# Patient Record
Sex: Male | Born: 1940 | Race: White | Hispanic: No | Marital: Married | Smoking: Former smoker
Health system: Southern US, Community
[De-identification: ages and names within clinical notes are randomized; demographics above are authoritative.]

## PROBLEM LIST (undated history)

## (undated) DIAGNOSIS — C801 Malignant (primary) neoplasm, unspecified: Secondary | ICD-10-CM

## (undated) DIAGNOSIS — I10 Essential (primary) hypertension: Secondary | ICD-10-CM

## (undated) DIAGNOSIS — F419 Anxiety disorder, unspecified: Secondary | ICD-10-CM

## (undated) DIAGNOSIS — E039 Hypothyroidism, unspecified: Secondary | ICD-10-CM

## (undated) DIAGNOSIS — R011 Cardiac murmur, unspecified: Secondary | ICD-10-CM

## (undated) DIAGNOSIS — E079 Disorder of thyroid, unspecified: Secondary | ICD-10-CM

## (undated) DIAGNOSIS — C4491 Basal cell carcinoma of skin, unspecified: Secondary | ICD-10-CM

## (undated) DIAGNOSIS — M1990 Unspecified osteoarthritis, unspecified site: Secondary | ICD-10-CM

## (undated) HISTORY — PX: ARTHROSCOPY KNEE W/ DRILLING: SUR92

## (undated) HISTORY — DX: Basal cell carcinoma of skin, unspecified: C44.91

## (undated) HISTORY — PX: OTHER SURGICAL HISTORY: SHX169

## (undated) HISTORY — PX: BASAL CELL CARCINOMA EXCISION: SHX1214

## (undated) HISTORY — DX: Anxiety disorder, unspecified: F41.9

## (undated) HISTORY — DX: Disorder of thyroid, unspecified: E07.9

## (undated) HISTORY — DX: Unspecified osteoarthritis, unspecified site: M19.90

## (undated) HISTORY — PX: TRANSURETHRAL RESECTION OF PROSTATE: SHX73

## (undated) HISTORY — PX: EYE SURGERY: SHX253

## (undated) HISTORY — PX: COLON SURGERY: SHX602

## (undated) HISTORY — PX: JOINT REPLACEMENT: SHX530

## (undated) HISTORY — DX: Malignant (primary) neoplasm, unspecified: C80.1

---

## 2002-04-18 ENCOUNTER — Encounter: Admission: RE | Admit: 2002-04-18 | Discharge: 2002-04-18 | Payer: Self-pay | Admitting: Family Medicine

## 2002-04-18 ENCOUNTER — Encounter: Payer: Self-pay | Admitting: Family Medicine

## 2002-04-26 ENCOUNTER — Encounter: Payer: Self-pay | Admitting: Specialist

## 2002-04-26 ENCOUNTER — Ambulatory Visit (HOSPITAL_COMMUNITY): Admission: RE | Admit: 2002-04-26 | Discharge: 2002-04-26 | Payer: Self-pay | Admitting: Specialist

## 2002-05-12 ENCOUNTER — Ambulatory Visit (HOSPITAL_BASED_OUTPATIENT_CLINIC_OR_DEPARTMENT_OTHER): Admission: RE | Admit: 2002-05-12 | Discharge: 2002-05-12 | Payer: Self-pay | Admitting: Specialist

## 2004-08-24 ENCOUNTER — Encounter: Admission: RE | Admit: 2004-08-24 | Discharge: 2004-08-24 | Payer: Self-pay | Admitting: Family Medicine

## 2004-09-03 ENCOUNTER — Encounter: Admission: RE | Admit: 2004-09-03 | Discharge: 2004-09-03 | Payer: Self-pay | Admitting: Family Medicine

## 2004-09-08 ENCOUNTER — Encounter: Admission: RE | Admit: 2004-09-08 | Discharge: 2004-09-08 | Payer: Self-pay | Admitting: Family Medicine

## 2004-11-20 ENCOUNTER — Encounter: Admission: RE | Admit: 2004-11-20 | Discharge: 2004-11-20 | Payer: Self-pay | Admitting: Family Medicine

## 2004-12-08 ENCOUNTER — Encounter: Admission: RE | Admit: 2004-12-08 | Discharge: 2004-12-08 | Payer: Self-pay | Admitting: Family Medicine

## 2004-12-08 ENCOUNTER — Other Ambulatory Visit: Admission: RE | Admit: 2004-12-08 | Discharge: 2004-12-08 | Payer: Self-pay | Admitting: Interventional Radiology

## 2005-02-27 ENCOUNTER — Ambulatory Visit (HOSPITAL_COMMUNITY): Admission: RE | Admit: 2005-02-27 | Discharge: 2005-02-28 | Payer: Self-pay

## 2005-09-01 ENCOUNTER — Ambulatory Visit: Payer: Self-pay | Admitting: Gastroenterology

## 2005-09-15 ENCOUNTER — Ambulatory Visit: Payer: Self-pay | Admitting: Gastroenterology

## 2005-12-29 IMAGING — US US SOFT TISSUE HEAD/NECK
1 series · 14 of 25 positions shown · non-contrast
Comparison: none

CLINICAL DATA: 63 year-old-male with left thyroid lesion. 
 THYROID ULTRASOUND:
TECHNIQUE: Ultrasound examination of the thyroid gland and adjacent soft tissue structures was performed.

[Series 1: unknown · 0.08mm/px · 14 of 44 slices shown]
[im 1/44]
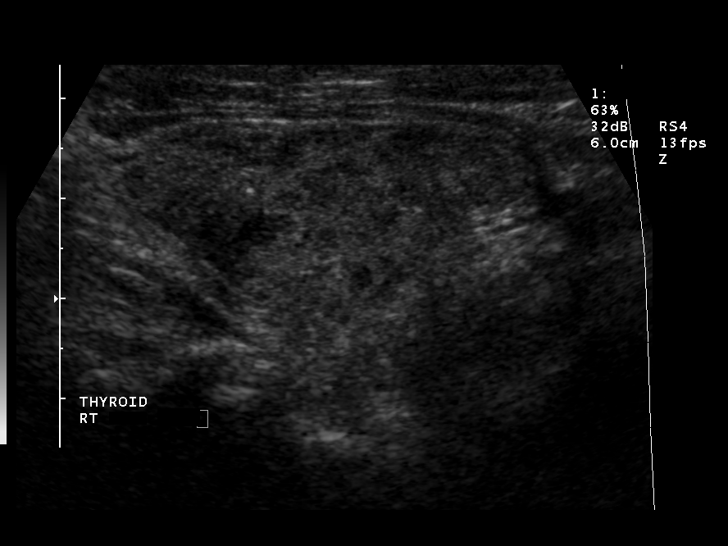
[im 4/44]
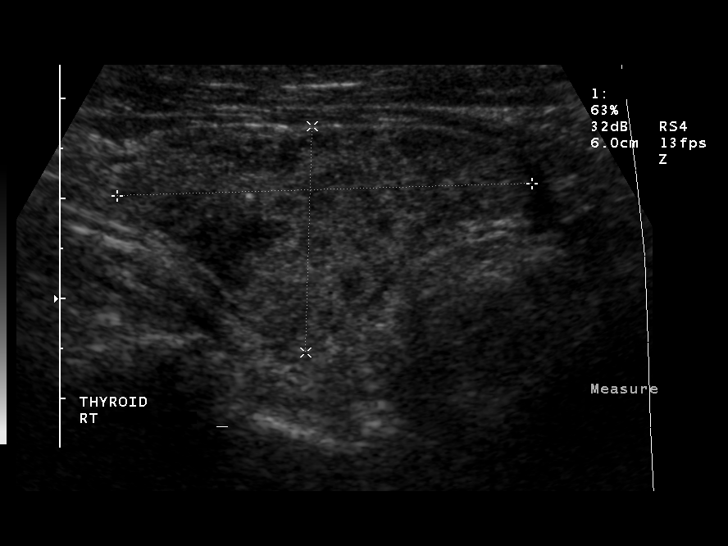
[im 8/44]
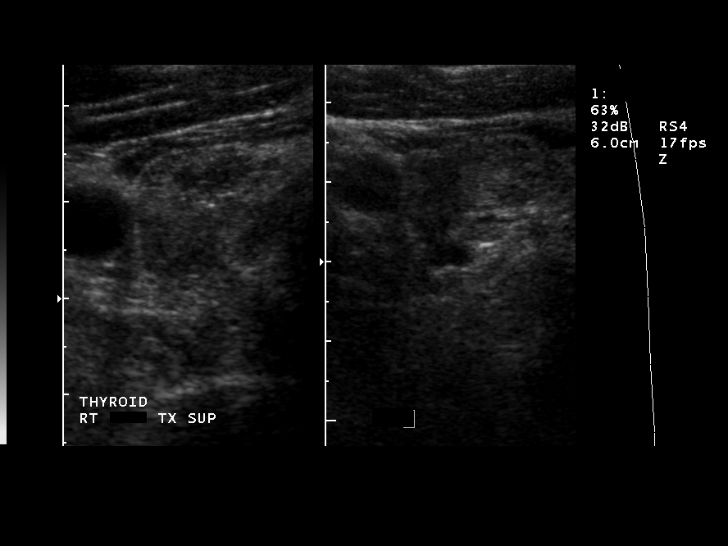
[im 11/44]
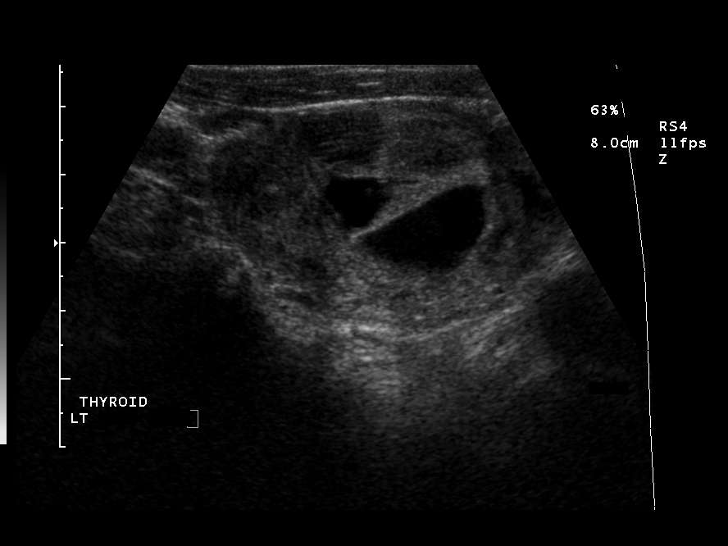
[im 15/44]
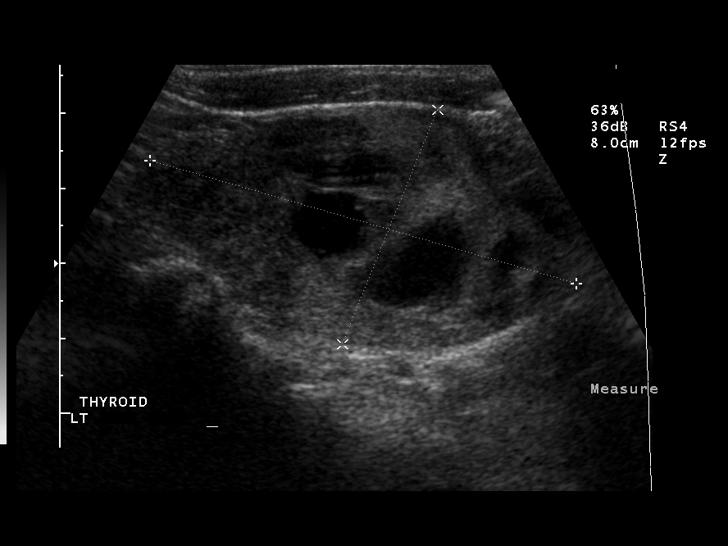
[im 17/44]
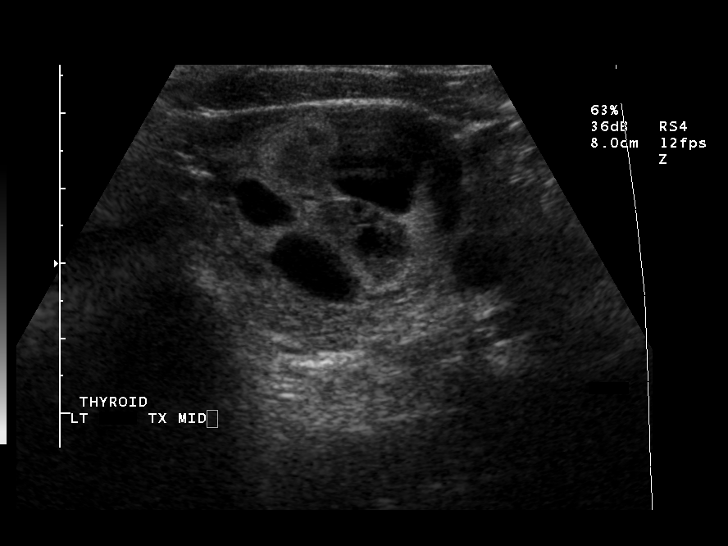
[im 20/44]
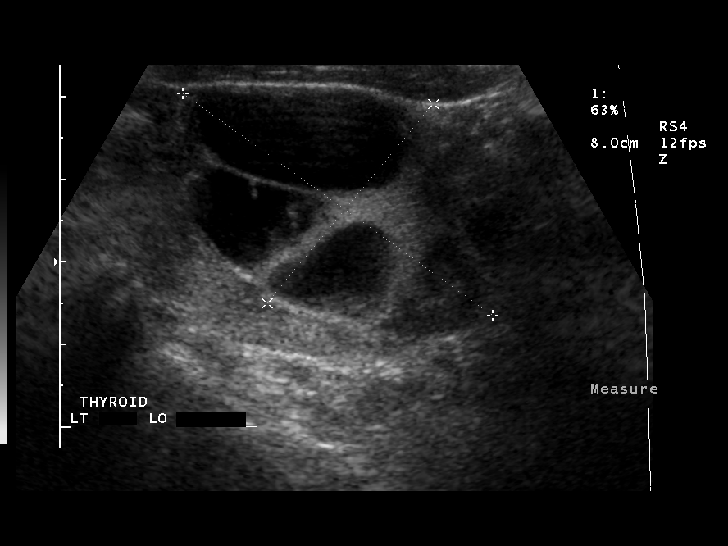
[im 24/44]
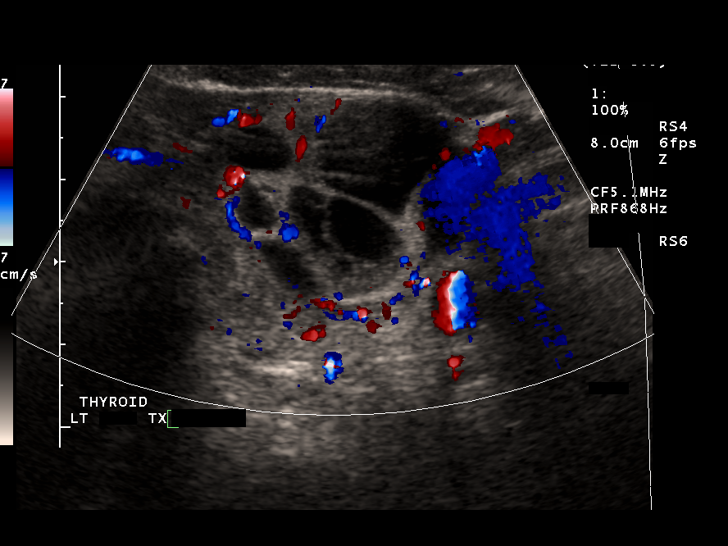
[im 27/44]
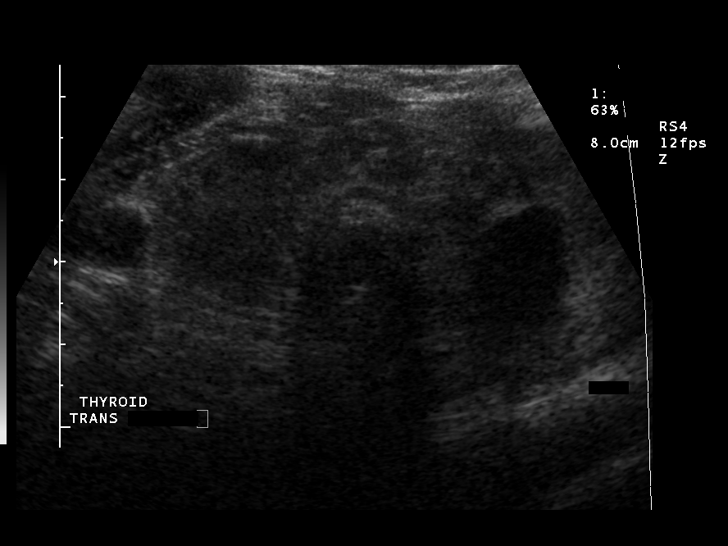
[im 29/44]
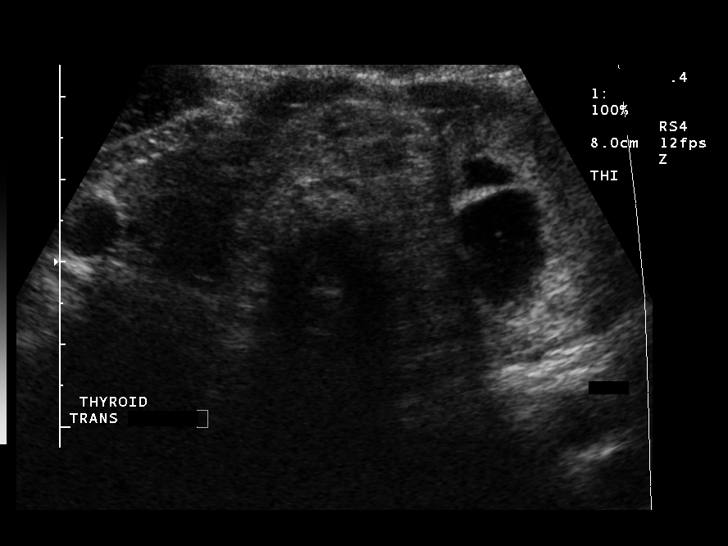
[im 33/44]
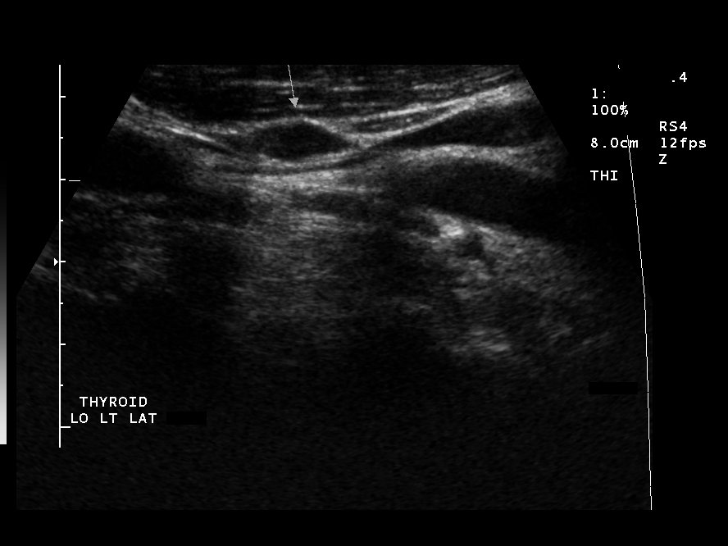
[im 36/44]
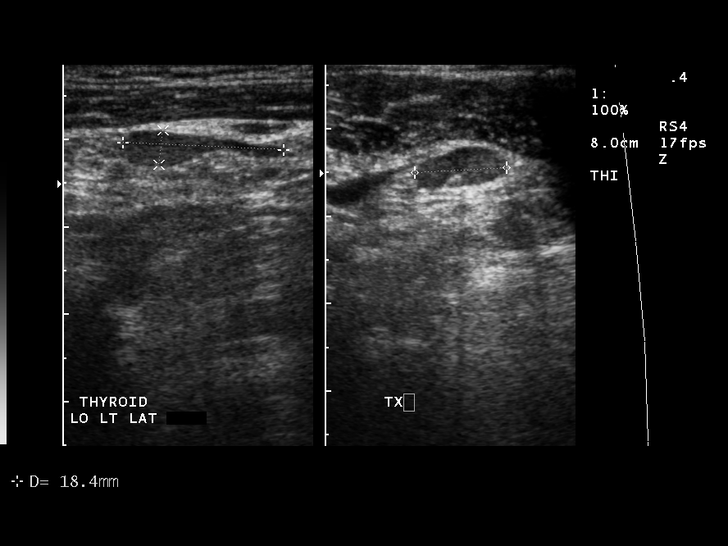
[im 40/44]
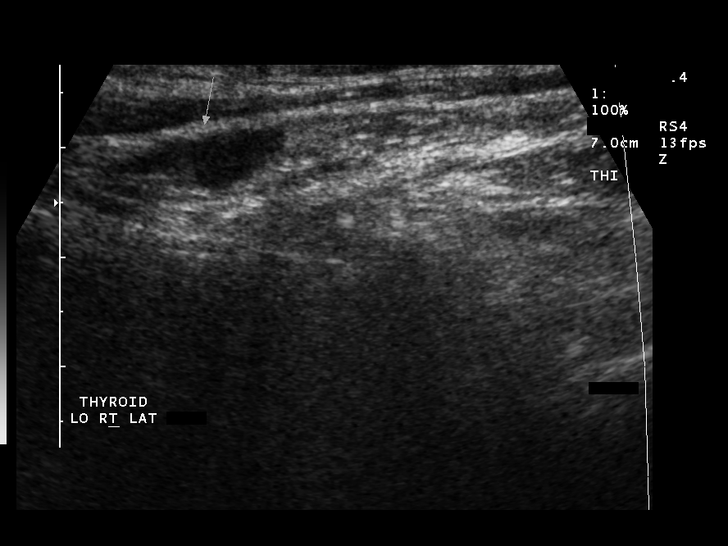
[im 44/44]
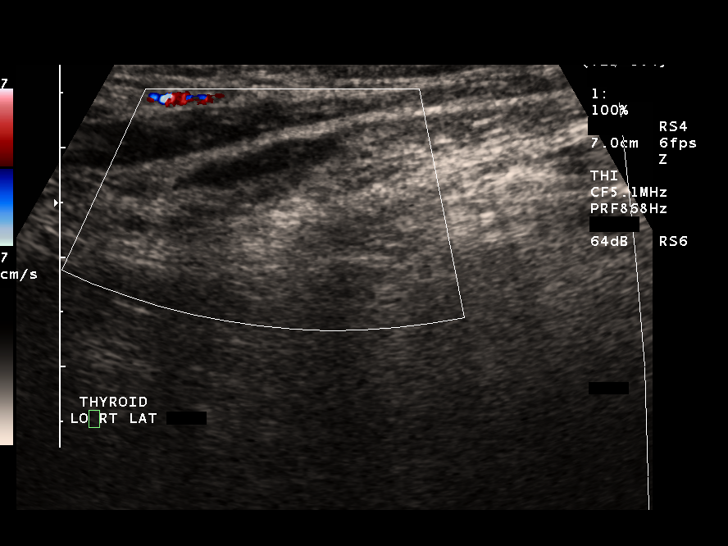

[14 of 25 positions shown; findings below may reference images not displayed]

FINDINGS: The right thyroid lobe measures 4.5 x 2.0 x 1.8 cm.  The left lobe measures 6.4 x 3.8 cm.  The isthmus measures .9 cm.   The echotexture of the thyroid gland is heterogeneous.  There is a large complex cystic and solid lesion and occupying most of the lower left thyroid gland which measures 4.6 x 3.1 x 3.1 cm.  The right gland is very heterogeneous and demonstrates some small ill-defined nodules.  Small lymph nodes are noted in the neck bilaterally.
IMPRESSION: Thyroid goiter.  There is a large complex cystic and solid lesion occupying most of the left thyroid lobe which needs to be biopsied.

## 2006-01-16 IMAGING — US US BIOPSY
1 series · 5 of 5 positions shown · non-contrast
Comparison: none

CLINICAL DATA: Large complex nodule of the left thyroid.  Request has been made for biopsy.
ULTRASOUND GUIDED THYROID BIOPSY LEFT LOBE OF THYROID:

[Series 1: unknown · 0.07mm/px · 5 of 5 slices shown]
[im 1/5]
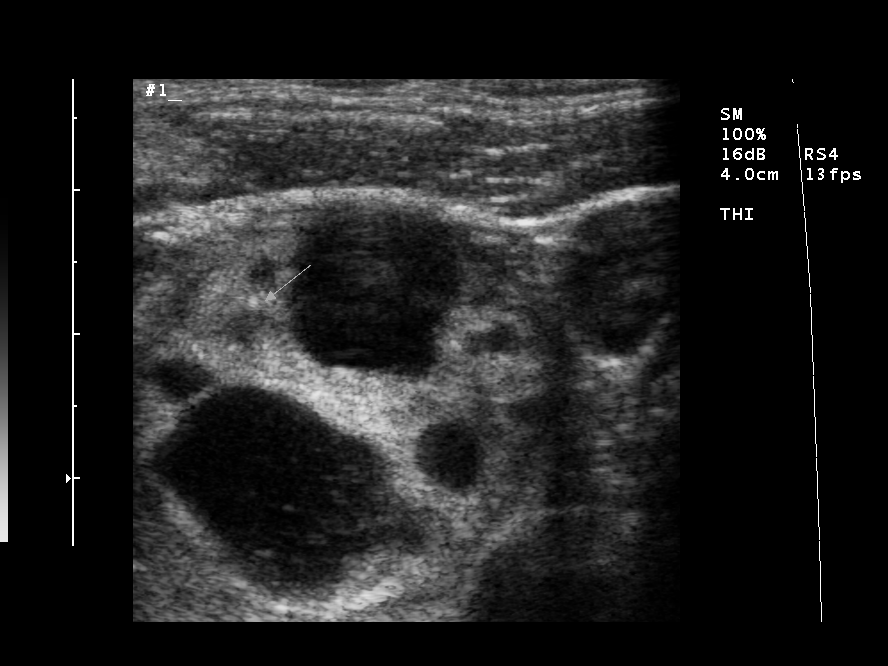
[im 2/5]
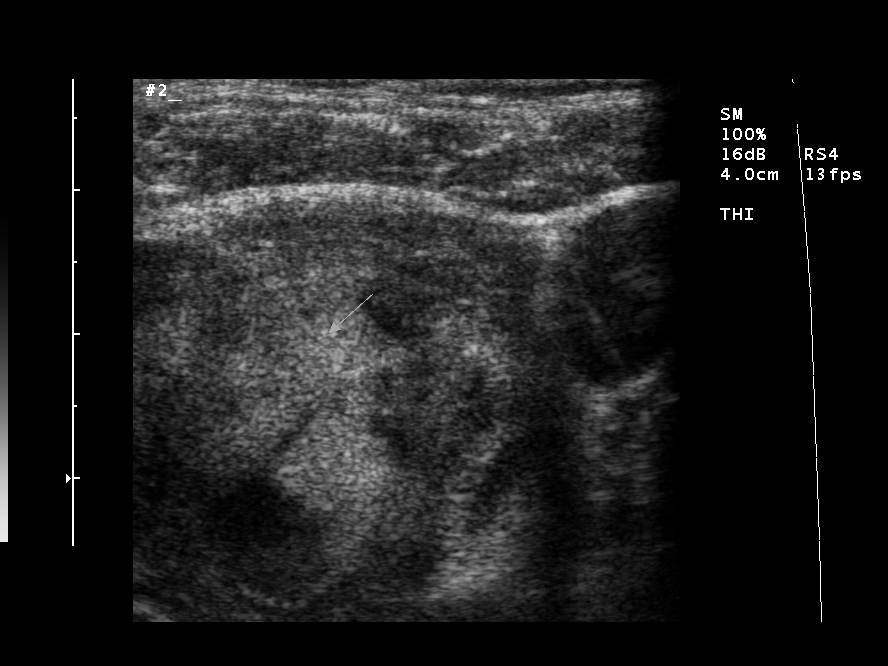
[im 3/5]
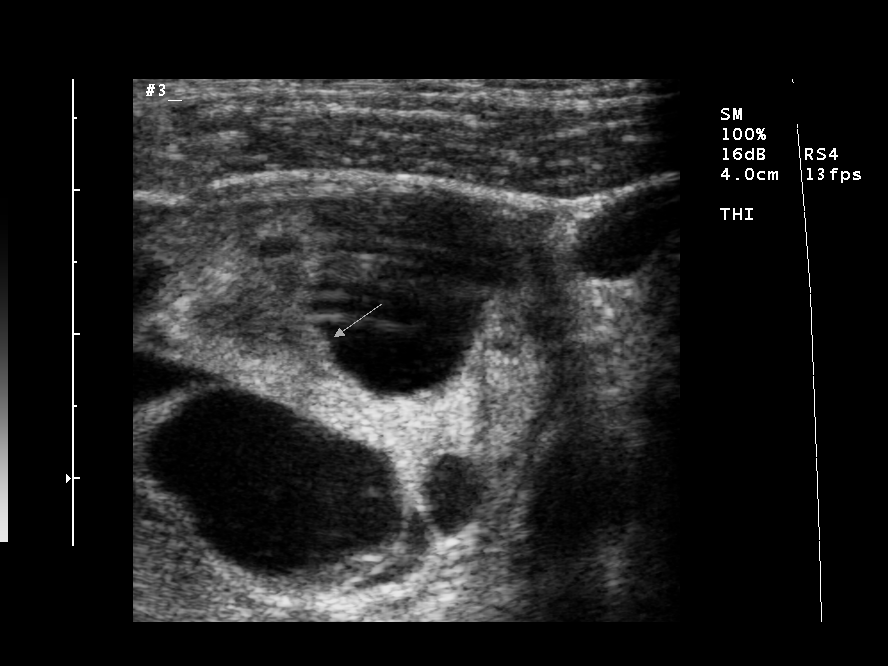
[im 4/5]
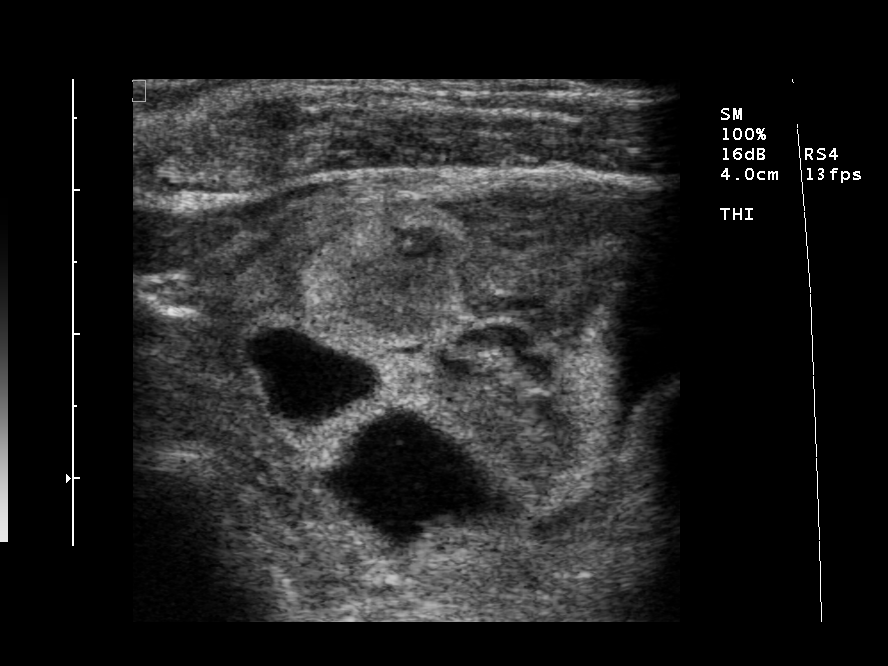
[im 5/5]
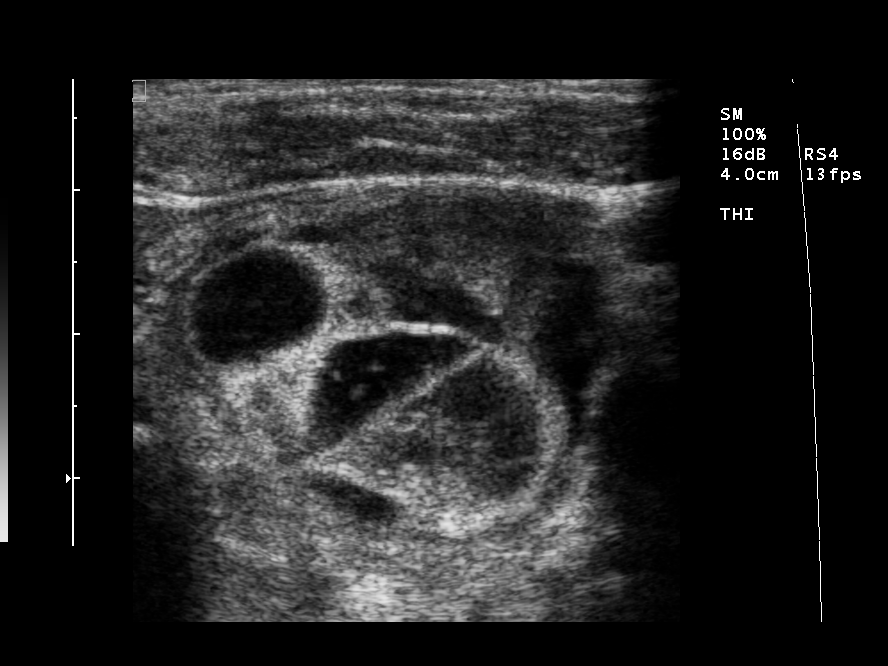

[5 of 5 positions shown; findings below may reference images not displayed]

FINDINGS: The above procedure was thoroughly discussed with the patient and written informed consent was obtained.
Ultrasound was then performed to localize and mark an adequate site for the biopsy.  The patient was then prepped and draped in a normal sterile fashion, 1% Lidocaine was used for local anesthesia.  Using direct ultrasound guidance, three passes were made using a 25 gauge hypodermic needle into the wall of the complex nodule located within the left lobe of the thyroid.  Ultrasound confirmed placement of the needle on all three occasions. Aspiration was performed of two the larger cysts within the thyroid as well.   The specimens were given to pathology for further analysis.  Post procedure imaging demonstrated no hematoma or immediate complication. The patient tolerated the procedure well.
IMPRESSION: Successful ultrasound guided fine needle aspiration left lobe of the thyroid.  Final pathology pending.

## 2010-07-31 ENCOUNTER — Other Ambulatory Visit: Payer: Self-pay | Admitting: Dermatology

## 2010-11-10 ENCOUNTER — Encounter: Payer: Self-pay | Admitting: Gastroenterology

## 2010-11-17 ENCOUNTER — Encounter: Payer: Self-pay | Admitting: Gastroenterology

## 2010-12-03 ENCOUNTER — Encounter: Payer: Self-pay | Admitting: Gastroenterology

## 2010-12-03 ENCOUNTER — Ambulatory Visit (AMBULATORY_SURGERY_CENTER): Payer: 59 | Admitting: *Deleted

## 2010-12-03 VITALS — Ht 68.0 in | Wt 180.2 lb

## 2010-12-03 DIAGNOSIS — Z1211 Encounter for screening for malignant neoplasm of colon: Secondary | ICD-10-CM

## 2010-12-03 MED ORDER — SUPREP BOWEL PREP KIT 17.5-3.13-1.6 GM/177ML PO SOLN: 1.0000 | Freq: Once | ORAL | Status: DC

## 2010-12-17 ENCOUNTER — Ambulatory Visit (AMBULATORY_SURGERY_CENTER): Payer: 59 | Admitting: Gastroenterology

## 2010-12-17 ENCOUNTER — Encounter: Payer: Self-pay | Admitting: Gastroenterology

## 2010-12-17 VITALS — BP 185/90 | HR 93 | Temp 98.5°F | Resp 22 | Ht 68.0 in | Wt 179.0 lb

## 2010-12-17 DIAGNOSIS — Z86010 Personal history of colonic polyps: Secondary | ICD-10-CM

## 2010-12-17 DIAGNOSIS — D126 Benign neoplasm of colon, unspecified: Secondary | ICD-10-CM

## 2010-12-17 DIAGNOSIS — Z1211 Encounter for screening for malignant neoplasm of colon: Secondary | ICD-10-CM

## 2010-12-17 MED ORDER — SODIUM CHLORIDE 0.9 % IV SOLN: 500.0000 mL | INTRAVENOUS | Status: AC

## 2010-12-17 NOTE — Patient Instructions (Signed)
Green and blue discharge instructions reviewed with patient and care partner.  Impressions/recommendations:  Polyps (handout given)  Resume medications as you were taking them prior to your procedure.

## 2010-12-17 NOTE — Progress Notes (Signed)
PT TALKATIVE WITH START OF SEDATION. MEDICINES TITRATED PER MD. WITH ADVANCING SCOPE TO CECUM, PT VERY UNCOMFORTABLE, GRIMACING, RESTLESS. MEDS TITRATED PER MD. AFTER REACHING CECUM, PT RESTING WITH EYES CLOSED. NO DISTRESS NOTED. DENIES PAIN. EWM

## 2010-12-18 ENCOUNTER — Telehealth: Payer: Self-pay | Admitting: *Deleted

## 2010-12-18 NOTE — Telephone Encounter (Signed)
No ID on voicemail; no message left 

## 2011-06-04 ENCOUNTER — Other Ambulatory Visit: Payer: Self-pay | Admitting: Dermatology

## 2013-10-27 ENCOUNTER — Encounter: Payer: Self-pay | Admitting: Gastroenterology

## 2014-02-20 ENCOUNTER — Other Ambulatory Visit (HOSPITAL_COMMUNITY): Payer: 59

## 2014-03-02 ENCOUNTER — Inpatient Hospital Stay (HOSPITAL_COMMUNITY): Admission: RE | Admit: 2014-03-02 | Payer: 59 | Source: Ambulatory Visit | Admitting: Orthopedic Surgery

## 2014-03-02 ENCOUNTER — Encounter (HOSPITAL_COMMUNITY): Admission: RE | Payer: Self-pay | Source: Ambulatory Visit

## 2014-03-02 SURGERY — ARTHROPLASTY, KNEE, TOTAL
Anesthesia: General | Site: Knee | Laterality: Left

## 2014-04-10 ENCOUNTER — Encounter (HOSPITAL_COMMUNITY): Payer: Self-pay

## 2014-04-10 ENCOUNTER — Encounter (HOSPITAL_COMMUNITY)
Admission: RE | Admit: 2014-04-10 | Discharge: 2014-04-10 | Disposition: A | Discharge status: Home / Self Care | Payer: Medicare Other | Source: Ambulatory Visit | Attending: Orthopedic Surgery | Admitting: Orthopedic Surgery

## 2014-04-10 DIAGNOSIS — M1712 Unilateral primary osteoarthritis, left knee: Secondary | ICD-10-CM | POA: Insufficient documentation

## 2014-04-10 DIAGNOSIS — Z01812 Encounter for preprocedural laboratory examination: Secondary | ICD-10-CM | POA: Diagnosis present

## 2014-04-10 DIAGNOSIS — Z01818 Encounter for other preprocedural examination: Secondary | ICD-10-CM | POA: Insufficient documentation

## 2014-04-10 HISTORY — DX: Cardiac murmur, unspecified: R01.1

## 2014-04-10 HISTORY — DX: Essential (primary) hypertension: I10

## 2014-04-10 HISTORY — DX: Hypothyroidism, unspecified: E03.9

## 2014-04-10 LAB — CBC
HCT: 43.9 % (ref 39.0–52.0)
Hemoglobin: 15.2 g/dL (ref 13.0–17.0)
MCH: 31.3 pg (ref 26.0–34.0)
MCHC: 34.6 g/dL (ref 30.0–36.0)
MCV: 90.5 fL (ref 78.0–100.0)
PLATELETS: 229 10*3/uL (ref 150–400)
RBC: 4.85 MIL/uL (ref 4.22–5.81)
RDW: 12.6 % (ref 11.5–15.5)
WBC: 8 10*3/uL (ref 4.0–10.5)

## 2014-04-10 LAB — PROTIME-INR
INR: 0.98 (ref 0.00–1.49)
Prothrombin Time: 13.1 seconds (ref 11.6–15.2)

## 2014-04-10 LAB — APTT: aPTT: 32 seconds (ref 24–37)

## 2014-04-10 LAB — SURGICAL PCR SCREEN
MRSA, PCR: NEGATIVE
STAPHYLOCOCCUS AUREUS: NEGATIVE

## 2014-04-10 LAB — BASIC METABOLIC PANEL
Anion gap: 6 (ref 5–15)
BUN: 19 mg/dL (ref 6–23)
CHLORIDE: 108 meq/L (ref 96–112)
CO2: 25 mmol/L (ref 19–32)
Calcium: 8.7 mg/dL (ref 8.4–10.5)
Creatinine, Ser: 1.34 mg/dL (ref 0.50–1.35)
GFR calc Af Amer: 58 mL/min — ABNORMAL LOW (ref >90)
GFR calc non Af Amer: 50 mL/min — ABNORMAL LOW (ref >90)
Glucose, Bld: 96 mg/dL (ref 70–99)
POTASSIUM: 4 mmol/L (ref 3.5–5.1)
Sodium: 139 mmol/L (ref 135–145)

## 2014-04-10 LAB — TYPE AND SCREEN
ABO/RH(D): B POS
Antibody Screen: NEGATIVE

## 2014-04-10 LAB — ABO/RH: ABO/RH(D): B POS

## 2014-04-10 NOTE — Progress Notes (Signed)
req'd ekg from dr Vallarie Mare badger at [employer] summer field.

## 2014-04-10 NOTE — Pre-Procedure Instructions (Addendum)
BHAVIN MONJARAZ  04/10/2014   Your procedure is scheduled on:  04/20/14  Report to [facility] short stay admitting at 900 AM.  Call this number if you have problems the morning of surgery: [phone]   Remember:   Do not eat food or drink liquids after midnight.   Take these medicines the morning of surgery with A SIP OF WATER: synthroid, xanax  if needed   STOP all herbel meds, nsaids (aleve,naproxen,advil,ibuprofen) 5 days prior to surgery starting 04/15/14   Do not wear jewelry, make-up or nail polish.  Do not wear lotions, powders, or perfumes. You may wear deodorant.  Do not shave 48 hours prior to surgery. Men may shave face and neck.  Do not bring valuables to the hospital.  [facility] is not responsible                  for any belongings or valuables.               Contacts, dentures or bridgework may not be worn into surgery.  Leave suitcase in the car. After surgery it may be brought to your room.  For patients admitted to the hospital, discharge time is determined by your                treatment team.               Patients discharged the day of surgery will not be allowed to drive  home.  Name and phone number of your driver:   Special Instructions:  Special Instructions: [place] - Preparing for Surgery  Before surgery, you can play an important role.  Because skin is not sterile, your skin needs to be as free of germs as possible.  You can reduce the number of germs on you skin by washing with CHG (chlorahexidine gluconate) soap before surgery.  CHG is an antiseptic cleaner which kills germs and bonds with the skin to continue killing germs even after washing.  Please DO NOT use if you have an allergy to CHG or antibacterial soaps.  If your skin becomes reddened/irritated stop using the CHG and inform your nurse when you arrive at Short Stay.  Do not shave (including legs and underarms) for at least 48 hours prior to the first CHG shower.  You may shave your  face.  Please follow these instructions carefully:   1.  Shower with CHG Soap the night before surgery and the morning of Surgery.  2.  If you choose to wash your hair, wash your hair first as usual with your normal shampoo.  3.  After you shampoo, rinse your hair and body thoroughly to remove the Shampoo.  4.  Use CHG as you would any other liquid soap.  You can apply chg directly  to the skin and wash gently with scrungie or a clean washcloth.  5.  Apply the CHG Soap to your body ONLY FROM THE NECK DOWN.  Do not use on open wounds or open sores.  Avoid contact with your eyes ears, mouth and genitals (private parts).  Wash genitals (private parts)       with your normal soap.  6.  Wash thoroughly, paying special attention to the area where your surgery will be performed.  7.  Thoroughly rinse your body with warm water from the neck down.  8.  DO NOT shower/wash with your normal soap after using and rinsing off the CHG Soap.  9.  Fraser Din  yourself dry with a clean towel.            10.  Wear clean pajamas.            11.  Place clean sheets on your bed the night of your first shower and do not sleep with pets.  Day of Surgery  Do not apply any lotions/deodorants the morning of surgery.  Please wear clean clothes to the hospital/surgery center.   Please read over the following fact sheets that you were given: Pain Booklet, Coughing and Deep Breathing, Blood Transfusion Information, Total Joint Packet, MRSA Information and Surgical Site Infection Prevention

## 2014-04-10 NOTE — H&P (Signed)
Jeffrey Marks is an 74 y.o. male.    Chief Complaint: left knee pain  HPI: Pt is a 74 y.o. male complaining of left knee pain for multiple years. Pain had continually increased since the beginning. X-rays in the clinic show end-stage arthritic changes of the left knee. Pt has tried various conservative treatments which have failed to alleviate their symptoms, including injections and therapy. Various options are discussed with the patient. Risks, benefits and expectations were discussed with the patient. Patient understand the risks, benefits and expectations and wishes to proceed with surgery.   PCP:  Chesley Noon, MD  D/C Plans:  Home with HHPT  PMH: Past Medical History  Diagnosis Date  . Anxiety   . Arthritis   . Cancer   . Cataract     s/p  . Thyroid disease   . Basal cell carcinoma     rt. forearm and nose  . Hypertension   . Heart murmur   . Hypothyroidism     PSH: Past Surgical History  Procedure Laterality Date  . Basal cell carcinoma excision      removal -rt forearm and nose  . Arthroscopy knee w/ drilling      rt. knee  . Thryoid biopsy      2006  . Transurethral resection of prostate      2009  . Eye surgery      cataracts  . Colon surgery      denies has had colonoscopy    Social History:  reports that he quit smoking about 3 years ago. His smoking use included Cigarettes. He smoked 0.00 packs per day. He has never used smokeless tobacco. He reports that he drinks about 12.0 oz of alcohol per week. He reports that he does not use illicit drugs.  Allergies:  Allergies  Allergen Reactions  . Sulfa Antibiotics Rash    Medications: Current Facility-Administered Medications  Medication Dose Route Frequency Provider Last Rate Last Dose  . 0.9 %  sodium chloride infusion  500 mL Intravenous Continuous Milus Banister, MD       Current Outpatient Prescriptions  Medication Sig Dispense Refill  . ALPRAZolam (XANAX) 0.5 MG tablet Take 0.5 mg by  mouth 2 (two) times daily.     Marland Kitchen ibuprofen (ADVIL,MOTRIN) 200 MG tablet Take 400-600 mg by mouth daily as needed (pain).    Marland Kitchen levothyroxine (SYNTHROID, LEVOTHROID) 112 MCG tablet Take 112 mcg by mouth daily before breakfast.     . lisinopril (PRINIVIL,ZESTRIL) 20 MG tablet Take 20 mg by mouth daily.     . Misc Natural Products (URINOZINC) CAPS Take 2 capsules by mouth daily. For prostate health    . sildenafil (VIAGRA) 50 MG tablet Take 25 mg by mouth as needed for erectile dysfunction.       Results for orders placed or performed during the hospital encounter of 04/10/14 (from the past 48 hour(s))  Type and screen     Status: None   Collection Time: 04/10/14  1:40 PM  Result Value Ref Range   ABO/RH(D) B POS    Antibody Screen NEG    Sample Expiration 04/24/2014   Surgical pcr screen     Status: None   Collection Time: 04/10/14  1:46 PM  Result Value Ref Range   MRSA, PCR NEGATIVE NEGATIVE   Staphylococcus aureus NEGATIVE NEGATIVE    Comment:        The Xpert SA Assay (FDA approved for NASAL specimens in patients over 21 years  of age), is one component of a comprehensive surveillance program.  Test performance has been validated by [employer] for patients greater than or equal to 81 year old. It is not intended to diagnose infection nor to guide or monitor treatment.   Protime-INR     Status: None   Collection Time: 04/10/14  1:46 PM  Result Value Ref Range   Prothrombin Time 13.1 11.6 - 15.2 seconds   INR 0.98 0.00 - 1.49  PTT     Status: None   Collection Time: 04/10/14  1:46 PM  Result Value Ref Range   aPTT 32 24 - 37 seconds  Basic metabolic panel     Status: Abnormal   Collection Time: 04/10/14  1:46 PM  Result Value Ref Range   Sodium 139 135 - 145 mmol/L    Comment: Please note change in reference range.   Potassium 4.0 3.5 - 5.1 mmol/L    Comment: Please note change in reference range.   Chloride 108 96 - 112 mEq/L   CO2 25 19 - 32 mmol/L   Glucose, Bld  96 70 - 99 mg/dL   BUN 19 6 - 23 mg/dL   Creatinine, Ser 1.34 0.50 - 1.35 mg/dL   Calcium 8.7 8.4 - 10.5 mg/dL   GFR calc non Af Amer 50 (L) >90 mL/min   GFR calc Af Amer 58 (L) >90 mL/min    Comment: (NOTE) The eGFR has been calculated using the CKD EPI equation. This calculation has not been validated in all clinical situations. eGFR's persistently <90 mL/min signify possible Chronic Kidney Disease.    Anion gap 6 5 - 15  CBC     Status: None   Collection Time: 04/10/14  1:46 PM  Result Value Ref Range   WBC 8.0 4.0 - 10.5 K/uL   RBC 4.85 4.22 - 5.81 MIL/uL   Hemoglobin 15.2 13.0 - 17.0 g/dL   HCT 43.9 39.0 - 52.0 %   MCV 90.5 78.0 - 100.0 fL   MCH 31.3 26.0 - 34.0 pg   MCHC 34.6 30.0 - 36.0 g/dL   RDW 12.6 11.5 - 15.5 %   Platelets 229 150 - 400 K/uL   No results found.  ROS: Pain with rom of the left lower extremity  Physical Exam:  Alert and oriented 74 y.o. male in no acute distress Cranial nerves 2-12 intact Cervical spine: full rom with no tenderness, nv intact distally Chest: active breath sounds bilaterally, no wheeze rhonchi or rales Heart: regular rate and rhythm, no murmur Abd: non tender non distended with active bowel sounds Hip is stable with rom  Left knee with moderate crepitus with rom nv intact distally Antalgic gait No rashes  Assessment/Plan Assessment: left knee end stage osteoarthritis   Plan: Patient will undergo a left total knee arthroplasty by Dr. Veverly Fells at [facility]. Risks benefits and expectations were discussed with the patient. Patient understand risks, benefits and expectations and wishes to proceed.

## 2014-04-19 MED ORDER — CEFAZOLIN SODIUM-DEXTROSE 2-3 GM-% IV SOLR
2.0000 g | INTRAVENOUS | Status: AC
  Administered 2014-04-20: 2 g via INTRAVENOUS
  Filled 2014-04-19: qty 50

## 2014-04-20 ENCOUNTER — Encounter (HOSPITAL_COMMUNITY)
Admission: RE | Disposition: A | Discharge status: Home Health | Payer: Self-pay | Source: Ambulatory Visit | Attending: Orthopedic Surgery

## 2014-04-20 ENCOUNTER — Inpatient Hospital Stay (HOSPITAL_COMMUNITY): Payer: Medicare Other | Admitting: Anesthesiology

## 2014-04-20 ENCOUNTER — Inpatient Hospital Stay (HOSPITAL_COMMUNITY)
Admission: RE | Admit: 2014-04-20 | Discharge: 2014-04-22 | DRG: 470 | Disposition: A | Discharge status: Home Health | Payer: Medicare Other | Source: Ambulatory Visit | Attending: Orthopedic Surgery | Admitting: Orthopedic Surgery

## 2014-04-20 ENCOUNTER — Inpatient Hospital Stay (HOSPITAL_COMMUNITY): Payer: Medicare Other

## 2014-04-20 ENCOUNTER — Encounter (HOSPITAL_COMMUNITY): Payer: Self-pay | Admitting: *Deleted

## 2014-04-20 DIAGNOSIS — I10 Essential (primary) hypertension: Secondary | ICD-10-CM | POA: Diagnosis present

## 2014-04-20 DIAGNOSIS — N320 Bladder-neck obstruction: Secondary | ICD-10-CM | POA: Diagnosis not present

## 2014-04-20 DIAGNOSIS — M25562 Pain in left knee: Secondary | ICD-10-CM | POA: Diagnosis present

## 2014-04-20 DIAGNOSIS — Z791 Long term (current) use of non-steroidal anti-inflammatories (NSAID): Secondary | ICD-10-CM | POA: Diagnosis not present

## 2014-04-20 DIAGNOSIS — Z800 Family history of malignant neoplasm of digestive organs: Secondary | ICD-10-CM

## 2014-04-20 DIAGNOSIS — Z87891 Personal history of nicotine dependence: Secondary | ICD-10-CM

## 2014-04-20 DIAGNOSIS — M1712 Unilateral primary osteoarthritis, left knee: Principal | ICD-10-CM | POA: Diagnosis present

## 2014-04-20 DIAGNOSIS — E039 Hypothyroidism, unspecified: Secondary | ICD-10-CM | POA: Diagnosis present

## 2014-04-20 DIAGNOSIS — Z85828 Personal history of other malignant neoplasm of skin: Secondary | ICD-10-CM

## 2014-04-20 DIAGNOSIS — Z96659 Presence of unspecified artificial knee joint: Secondary | ICD-10-CM

## 2014-04-20 DIAGNOSIS — Z881 Allergy status to other antibiotic agents status: Secondary | ICD-10-CM

## 2014-04-20 DIAGNOSIS — Z79899 Other long term (current) drug therapy: Secondary | ICD-10-CM

## 2014-04-20 DIAGNOSIS — N359 Urethral stricture, unspecified: Secondary | ICD-10-CM | POA: Diagnosis present

## 2014-04-20 HISTORY — PX: CYSTOSCOPY: SHX5120

## 2014-04-20 HISTORY — PX: TOTAL KNEE ARTHROPLASTY: SHX125

## 2014-04-20 SURGERY — ARTHROPLASTY, KNEE, TOTAL
Anesthesia: Spinal | Site: Knee | Laterality: Left

## 2014-04-20 MED ORDER — FENTANYL CITRATE 0.05 MG/ML IJ SOLN
INTRAMUSCULAR | Status: AC
  Filled 2014-04-20: qty 5

## 2014-04-20 MED ORDER — SODIUM CHLORIDE 0.9 % IV SOLN
INTRAVENOUS | Status: DC
  Administered 2014-04-20 – 2014-04-21 (×2): via INTRAVENOUS

## 2014-04-20 MED ORDER — OXYCODONE HCL 5 MG PO TABS: 5.0000 mg | ORAL_TABLET | Freq: Once | ORAL | Status: DC | PRN

## 2014-04-20 MED ORDER — METOCLOPRAMIDE HCL 5 MG/ML IJ SOLN: 5.0000 mg | Freq: Three times a day (TID) | INTRAMUSCULAR | Status: DC | PRN

## 2014-04-20 MED ORDER — HYDROMORPHONE HCL 1 MG/ML IJ SOLN
INTRAMUSCULAR | Status: DC | PRN
  Administered 2014-04-20 (×2): 0.5 mg via INTRAVENOUS

## 2014-04-20 MED ORDER — MIDAZOLAM HCL 2 MG/2ML IJ SOLN
1.0000 mg | INTRAMUSCULAR | Status: DC | PRN
  Administered 2014-04-20: 1 mg via INTRAVENOUS

## 2014-04-20 MED ORDER — HYDROMORPHONE HCL 1 MG/ML IJ SOLN
INTRAMUSCULAR | Status: AC
  Filled 2014-04-20: qty 1

## 2014-04-20 MED ORDER — ALPRAZOLAM 0.5 MG PO TABS
0.5000 mg | ORAL_TABLET | Freq: Two times a day (BID) | ORAL | Status: DC
  Administered 2014-04-20 – 2014-04-22 (×4): 0.5 mg via ORAL
  Filled 2014-04-20 (×4): qty 1

## 2014-04-20 MED ORDER — FENTANYL CITRATE 0.05 MG/ML IJ SOLN: 25.0000 ug | INTRAMUSCULAR | Status: DC | PRN

## 2014-04-20 MED ORDER — FENTANYL CITRATE 0.05 MG/ML IJ SOLN
50.0000 ug | INTRAMUSCULAR | Status: DC | PRN
  Administered 2014-04-20 (×5): 50 ug via INTRAVENOUS

## 2014-04-20 MED ORDER — FENTANYL CITRATE 0.05 MG/ML IJ SOLN
INTRAMUSCULAR | Status: DC | PRN
  Administered 2014-04-20 (×5): 50 ug via INTRAVENOUS

## 2014-04-20 MED ORDER — ONDANSETRON HCL 4 MG/2ML IJ SOLN: 4.0000 mg | Freq: Once | INTRAMUSCULAR | Status: DC | PRN

## 2014-04-20 MED ORDER — CHLORHEXIDINE GLUCONATE 4 % EX LIQD: 60.0000 mL | Freq: Once | CUTANEOUS | Status: DC

## 2014-04-20 MED ORDER — MIDAZOLAM HCL 2 MG/2ML IJ SOLN
1.0000 mg | INTRAMUSCULAR | Status: AC | PRN
  Administered 2014-04-20 (×2): 1 mg via INTRAVENOUS

## 2014-04-20 MED ORDER — OXYCODONE-ACETAMINOPHEN 5-325 MG PO TABS: 1.0000 | ORAL_TABLET | ORAL | Status: AC | PRN

## 2014-04-20 MED ORDER — METHOCARBAMOL 500 MG PO TABS
500.0000 mg | ORAL_TABLET | Freq: Four times a day (QID) | ORAL | Status: DC | PRN
  Administered 2014-04-20 – 2014-04-22 (×3): 500 mg via ORAL
  Filled 2014-04-20 (×4): qty 1

## 2014-04-20 MED ORDER — PHENOL 1.4 % MT LIQD: 1.0000 | OROMUCOSAL | Status: DC | PRN

## 2014-04-20 MED ORDER — FENTANYL CITRATE 0.05 MG/ML IJ SOLN
INTRAMUSCULAR | Status: AC
  Administered 2014-04-20: 50 ug via INTRAVENOUS
  Filled 2014-04-20: qty 2

## 2014-04-20 MED ORDER — PROPOFOL INFUSION 10 MG/ML OPTIME
INTRAVENOUS | Status: DC | PRN
  Administered 2014-04-20: 100 ug/kg/min via INTRAVENOUS

## 2014-04-20 MED ORDER — SILDENAFIL CITRATE 50 MG PO TABS: 25.0000 mg | ORAL_TABLET | ORAL | Status: DC | PRN

## 2014-04-20 MED ORDER — WARFARIN - PHARMACIST DOSING INPATIENT: Freq: Every day | Status: DC

## 2014-04-20 MED ORDER — LACTATED RINGERS IV SOLN
INTRAVENOUS | Status: DC
  Administered 2014-04-20 (×2): via INTRAVENOUS

## 2014-04-20 MED ORDER — URINOZINC PO CAPS: 2.0000 | ORAL_CAPSULE | Freq: Every day | ORAL | Status: DC

## 2014-04-20 MED ORDER — FERROUS SULFATE 325 (65 FE) MG PO TABS
325.0000 mg | ORAL_TABLET | Freq: Three times a day (TID) | ORAL | Status: DC
  Administered 2014-04-20 – 2014-04-22 (×5): 325 mg via ORAL
  Filled 2014-04-20 (×8): qty 1

## 2014-04-20 MED ORDER — LEVOTHYROXINE SODIUM 112 MCG PO TABS
112.0000 ug | ORAL_TABLET | Freq: Every day | ORAL | Status: DC
  Administered 2014-04-21 – 2014-04-22 (×2): 112 ug via ORAL
  Filled 2014-04-20 (×3): qty 1

## 2014-04-20 MED ORDER — PROPOFOL 10 MG/ML IV BOLUS
INTRAVENOUS | Status: AC
  Filled 2014-04-20: qty 20

## 2014-04-20 MED ORDER — MENTHOL 3 MG MT LOZG: 1.0000 | LOZENGE | OROMUCOSAL | Status: DC | PRN

## 2014-04-20 MED ORDER — METOCLOPRAMIDE HCL 10 MG PO TABS: 5.0000 mg | ORAL_TABLET | Freq: Three times a day (TID) | ORAL | Status: DC | PRN

## 2014-04-20 MED ORDER — FENTANYL CITRATE 0.05 MG/ML IJ SOLN
INTRAMUSCULAR | Status: AC
  Filled 2014-04-20: qty 2

## 2014-04-20 MED ORDER — 0.9 % SODIUM CHLORIDE (POUR BTL) OPTIME
TOPICAL | Status: DC | PRN
  Administered 2014-04-20: 1000 mL

## 2014-04-20 MED ORDER — OXYCODONE HCL 5 MG/5ML PO SOLN: 5.0000 mg | Freq: Once | ORAL | Status: DC | PRN

## 2014-04-20 MED ORDER — ONDANSETRON HCL 4 MG PO TABS: 4.0000 mg | ORAL_TABLET | Freq: Four times a day (QID) | ORAL | Status: DC | PRN

## 2014-04-20 MED ORDER — ACETAMINOPHEN 325 MG PO TABS: 650.0000 mg | ORAL_TABLET | Freq: Four times a day (QID) | ORAL | Status: DC | PRN

## 2014-04-20 MED ORDER — SODIUM CHLORIDE 0.9 % IR SOLN
Status: DC | PRN
  Administered 2014-04-20: 3000 mL

## 2014-04-20 MED ORDER — OXYCODONE HCL 5 MG PO TABS
5.0000 mg | ORAL_TABLET | ORAL | Status: DC | PRN
  Administered 2014-04-20 – 2014-04-22 (×7): 10 mg via ORAL
  Filled 2014-04-20 (×7): qty 2

## 2014-04-20 MED ORDER — ACETAMINOPHEN 650 MG RE SUPP: 650.0000 mg | Freq: Four times a day (QID) | RECTAL | Status: DC | PRN

## 2014-04-20 MED ORDER — WARFARIN SODIUM 7.5 MG PO TABS
7.5000 mg | ORAL_TABLET | Freq: Once | ORAL | Status: AC
  Administered 2014-04-20: 7.5 mg via ORAL
  Filled 2014-04-20: qty 1

## 2014-04-20 MED ORDER — BISACODYL 10 MG RE SUPP: 10.0000 mg | Freq: Every day | RECTAL | Status: DC | PRN

## 2014-04-20 MED ORDER — ONDANSETRON HCL 4 MG/2ML IJ SOLN
4.0000 mg | Freq: Four times a day (QID) | INTRAMUSCULAR | Status: DC | PRN
  Administered 2014-04-20: 4 mg via INTRAVENOUS
  Filled 2014-04-20: qty 2

## 2014-04-20 MED ORDER — ONDANSETRON HCL 4 MG/2ML IJ SOLN
INTRAMUSCULAR | Status: AC
  Filled 2014-04-20: qty 2

## 2014-04-20 MED ORDER — HYDROMORPHONE HCL 1 MG/ML IJ SOLN
1.0000 mg | INTRAMUSCULAR | Status: DC | PRN
  Administered 2014-04-20: 1 mg via INTRAVENOUS
  Filled 2014-04-20 (×2): qty 1

## 2014-04-20 MED ORDER — POLYETHYLENE GLYCOL 3350 17 G PO PACK: 17.0000 g | PACK | Freq: Every day | ORAL | Status: DC | PRN

## 2014-04-20 MED ORDER — LIDOCAINE HCL (CARDIAC) 20 MG/ML IV SOLN
INTRAVENOUS | Status: AC
  Filled 2014-04-20: qty 5

## 2014-04-20 MED ORDER — LISINOPRIL 20 MG PO TABS
20.0000 mg | ORAL_TABLET | Freq: Every day | ORAL | Status: DC
  Administered 2014-04-20 – 2014-04-22 (×3): 20 mg via ORAL
  Filled 2014-04-20 (×3): qty 1

## 2014-04-20 MED ORDER — METHOCARBAMOL 1000 MG/10ML IJ SOLN
500.0000 mg | Freq: Four times a day (QID) | INTRAVENOUS | Status: DC | PRN
  Filled 2014-04-20: qty 5

## 2014-04-20 MED ORDER — MIDAZOLAM HCL 2 MG/2ML IJ SOLN
INTRAMUSCULAR | Status: AC
  Filled 2014-04-20: qty 2

## 2014-04-20 MED ORDER — METHOCARBAMOL 500 MG PO TABS: 500.0000 mg | ORAL_TABLET | Freq: Three times a day (TID) | ORAL | Status: AC | PRN

## 2014-04-20 MED ORDER — MIDAZOLAM HCL 2 MG/2ML IJ SOLN
INTRAMUSCULAR | Status: AC
  Administered 2014-04-20: 1 mg via INTRAVENOUS
  Filled 2014-04-20: qty 2

## 2014-04-20 MED ORDER — LIDOCAINE HCL 2 % EX GEL
CUTANEOUS | Status: AC
  Filled 2014-04-20: qty 20

## 2014-04-20 MED ORDER — CEFAZOLIN SODIUM-DEXTROSE 2-3 GM-% IV SOLR
2.0000 g | Freq: Four times a day (QID) | INTRAVENOUS | Status: AC
  Administered 2014-04-20 – 2014-04-21 (×2): 2 g via INTRAVENOUS
  Filled 2014-04-20 (×2): qty 50

## 2014-04-20 MED ORDER — WARFARIN SODIUM 5 MG PO TABS: 5.0000 mg | ORAL_TABLET | Freq: Every day | ORAL | Status: AC

## 2014-04-20 SURGICAL SUPPLY — 62 items
BALLN NEPHROSTOMY (BALLOONS) ×4
BALLOON NEPHROSTOMY (BALLOONS) IMPLANT
BANDAGE ESMARK 6X9 LF (GAUZE/BANDAGES/DRESSINGS) ×2 IMPLANT
BLADE SAG 18X100X1.27 (BLADE) ×4 IMPLANT
BLADE SAW SGTL 13.0X1.19X90.0M (BLADE) ×4 IMPLANT
BNDG CMPR 9X6 STRL LF SNTH (GAUZE/BANDAGES/DRESSINGS) ×2
BNDG CMPR MED 10X6 ELC LF (GAUZE/BANDAGES/DRESSINGS) ×2
BNDG ELASTIC 6X10 VLCR STRL LF (GAUZE/BANDAGES/DRESSINGS) ×4 IMPLANT
BNDG ESMARK 6X9 LF (GAUZE/BANDAGES/DRESSINGS) ×4
BNDG GAUZE ELAST 4 BULKY (GAUZE/BANDAGES/DRESSINGS) ×8 IMPLANT
BOWL SMART MIX CTS (DISPOSABLE) ×4 IMPLANT
CAP KNEE TOTAL 3 SIGMA ×2 IMPLANT
CATH COUDE FOLEY 2W 5CC 16FR (CATHETERS) ×4 IMPLANT
CEMENT HV SMART SET (Cement) ×8 IMPLANT
CLOSURE WOUND 1/2 X4 (GAUZE/BANDAGES/DRESSINGS) ×2
COVER SURGICAL LIGHT HANDLE (MISCELLANEOUS) ×4 IMPLANT
CUFF TOURNIQUET SINGLE 34IN LL (TOURNIQUET CUFF) IMPLANT
CUFF TOURNIQUET SINGLE 44IN (TOURNIQUET CUFF) IMPLANT
DRAPE EXTREMITY T 121X128X90 (DRAPE) ×4 IMPLANT
DRAPE IMP U-DRAPE 54X76 (DRAPES) ×4 IMPLANT
DRAPE PROXIMA HALF (DRAPES) ×4 IMPLANT
DRAPE U-SHAPE 47X51 STRL (DRAPES) ×4 IMPLANT
DRSG ADAPTIC 3X8 NADH LF (GAUZE/BANDAGES/DRESSINGS) ×4 IMPLANT
DRSG PAD ABDOMINAL 8X10 ST (GAUZE/BANDAGES/DRESSINGS) ×8 IMPLANT
DURAPREP 26ML APPLICATOR (WOUND CARE) ×4 IMPLANT
ELECT CAUTERY BLADE 6.4 (BLADE) ×4 IMPLANT
ELECT REM PT RETURN 9FT ADLT (ELECTROSURGICAL) ×4
ELECTRODE REM PT RTRN 9FT ADLT (ELECTROSURGICAL) ×2 IMPLANT
GAUZE SPONGE 4X4 12PLY STRL (GAUZE/BANDAGES/DRESSINGS) ×4 IMPLANT
GLOVE BIOGEL PI ORTHO PRO 7.5 (GLOVE) ×2
GLOVE BIOGEL PI ORTHO PRO SZ8 (GLOVE) ×2
GLOVE ORTHO TXT STRL SZ7.5 (GLOVE) ×4 IMPLANT
GLOVE PI ORTHO PRO STRL 7.5 (GLOVE) ×2 IMPLANT
GLOVE PI ORTHO PRO STRL SZ8 (GLOVE) ×2 IMPLANT
GLOVE SURG ORTHO 8.5 STRL (GLOVE) ×4 IMPLANT
GOWN STRL REUS W/ TWL XL LVL3 (GOWN DISPOSABLE) ×6 IMPLANT
GOWN STRL REUS W/TWL XL LVL3 (GOWN DISPOSABLE) ×12
HANDPIECE INTERPULSE COAX TIP (DISPOSABLE) ×4
IMMOBILIZER KNEE 22 UNIV (SOFTGOODS) ×2 IMPLANT
KIT BASIN OR (CUSTOM PROCEDURE TRAY) ×4 IMPLANT
KIT MANIFOLD (MISCELLANEOUS) ×4 IMPLANT
KIT ROOM TURNOVER OR (KITS) ×4 IMPLANT
MANIFOLD NEPTUNE II (INSTRUMENTS) ×4 IMPLANT
NS IRRIG 1000ML POUR BTL (IV SOLUTION) ×4 IMPLANT
PACK TOTAL JOINT (CUSTOM PROCEDURE TRAY) ×4 IMPLANT
PACK UNIVERSAL I (CUSTOM PROCEDURE TRAY) ×4 IMPLANT
PAD ARMBOARD 7.5X6 YLW CONV (MISCELLANEOUS) ×8 IMPLANT
SET HNDPC FAN SPRY TIP SCT (DISPOSABLE) ×2 IMPLANT
STRIP CLOSURE SKIN 1/2X4 (GAUZE/BANDAGES/DRESSINGS) ×6 IMPLANT
SUCTION FRAZIER TIP 10 FR DISP (SUCTIONS) ×4 IMPLANT
SUT MNCRL AB 3-0 PS2 18 (SUTURE) ×4 IMPLANT
SUT VIC AB 0 CT1 27 (SUTURE) ×8
SUT VIC AB 0 CT1 27XBRD ANBCTR (SUTURE) ×4 IMPLANT
SUT VIC AB 1 CT1 27 (SUTURE) ×12
SUT VIC AB 1 CT1 27XBRD ANBCTR (SUTURE) ×6 IMPLANT
SUT VIC AB 2-0 CT1 27 (SUTURE) ×8
SUT VIC AB 2-0 CT1 TAPERPNT 27 (SUTURE) ×4 IMPLANT
TOWEL OR 17X24 6PK STRL BLUE (TOWEL DISPOSABLE) ×4 IMPLANT
TOWEL OR 17X26 10 PK STRL BLUE (TOWEL DISPOSABLE) ×4 IMPLANT
TRAY FOLEY CATH 14FR (SET/KITS/TRAYS/PACK) ×2 IMPLANT
TRAY FOLEY CATH 16FRSI W/METER (SET/KITS/TRAYS/PACK) IMPLANT
WATER STERILE IRR 1000ML POUR (IV SOLUTION) ×8 IMPLANT

## 2014-04-20 NOTE — Consult Note (Addendum)
Urology Consult   Physician requesting consult: Dr. Netta Cedars  Reason for consult: Difficult urethral catheterization  History of Present Illness: Jeffrey Marks is a 74 y.o. with a history of BPH previously followed by Dr. Janice Norrie s/p a TURP in 2009.  He was taken to the OR this morning for a total knee arthroplasty by Dr. Veverly Fells.  Attempts to place a 16 Fr and 14 Fr catheter were unsuccessful.  A urology consult was obtained.  I was unable to obtain further history with the patient under anesthesia. It was felt that he would need a catheter since he did have a spinal anesthetic.   Past Medical History  Diagnosis Date  . Anxiety   . Arthritis   . Cancer   . Cataract     s/p  . Thyroid disease   . Basal cell carcinoma     rt. forearm and nose  . Hypertension   . Heart murmur   . Hypothyroidism     Past Surgical History  Procedure Laterality Date  . Basal cell carcinoma excision      removal -rt forearm and nose  . Arthroscopy knee w/ drilling      rt. knee  . Thryoid biopsy      2006  . Transurethral resection of prostate      2009  . Eye surgery      cataracts  . Colon surgery      denies has had colonoscopy    Medications:  Home meds:    Medication List    ASK your doctor about these medications        ALPRAZolam 0.5 MG tablet  Commonly known as:  XANAX  Take 0.5 mg by mouth 2 (two) times daily.     ibuprofen 200 MG tablet  Commonly known as:  ADVIL,MOTRIN  Take 400-600 mg by mouth daily as needed (pain).     levothyroxine 112 MCG tablet  Commonly known as:  SYNTHROID, LEVOTHROID  Take 112 mcg by mouth daily before breakfast.     lisinopril 20 MG tablet  Commonly known as:  PRINIVIL,ZESTRIL  Take 20 mg by mouth daily.     sildenafil 50 MG tablet  Commonly known as:  VIAGRA  Take 25 mg by mouth as needed for erectile dysfunction.     URINOZINC Caps  Take 2 capsules by mouth daily. For prostate health        Scheduled Meds: . chlorhexidine   60 mL Topical Once   Continuous Infusions: . lactated ringers 50 mL/hr at 04/20/14 0904   PRN Meds:.0.9 % irrigation (POUR BTL), fentaNYL, midazolam, sodium chloride irrigation  Allergies:  Allergies  Allergen Reactions  . Sulfa Antibiotics Rash    Family History  Problem Relation Age of Onset  . Stomach cancer Mother   . Colon cancer Neg Hx     Social History:  reports that he quit smoking about 3 years ago. His smoking use included Cigarettes. He has never used smokeless tobacco. He reports that he drinks about 12.0 oz of alcohol per week. He reports that he does not use illicit drugs.  ROS: Unable to obtain with patient sedated.  Physical Exam:  Vital signs in last 24 hours: Temp:  [97.6 F (36.4 C)] 97.6 F (36.4 C) (01/22 0835) Pulse Rate:  [71-115] 103 (01/22 1035) Resp:  [11-28] 17 (01/22 1035) BP: (165-201)/(80-96) 169/89 mmHg (01/22 1035) SpO2:  [92 %-100 %] 100 % (01/22 1035) Weight:  [71.668 kg (158 lb)] 71.668  kg (158 lb) (01/22 0835) Constitutional:  Pt sedated. Cardiovascular: Regular rate and rhythm Respiratory: Normal respiratory effort GI: Abdomen is soft and non-distended Genitourinary:  Normal uncircumcised male phallus, testes are descended bilaterally and non-tender and without masses, scrotum is normal in appearance without lesions or masses, perineum is normal on inspection. Neurologic: Unable to assess due to sedation Psychiatric: Unable to examine due to sedation  Procedure: He was prepped and draped in the usual sterile fashion. He had been administered preoperative antibiotics. I attempted placement of a 12 Fr catheter with suspicion that he may have a bladder neck stenosis.  This met resistance at the bladder neck.  I therefore performed flexible cystoscopy with the 16 Fr cystoscope that confirmed a normal urethra up to the bladder neck where a small opening to the bladder was noted consistent with bladder neck stenosis.  I inserted a 0.38  guidewire through the opening.  The cystoscope was removed and the 24 Fr balloon dilator (Ultraxx) was placed over the wire across the bladder neck.  It was inflated to 18 mmHg pressure for 3 minutes.  The balloon was then deflated and removed and a 16 Fr council tip catheter was inserted over the wire into the bladder. Clear urine returned. The catheter balloon was inflated with 10 cc of sterile water and the catheter was placed to straight drainage.  Impression/Recommendation Bladder neck stenosis causing difficult catheterization:  I would leave the indwelling catheter for now.  He may be able to undergo a voiding trial in the hospital or as an outpatient with Dr. Janice Norrie.  Will follow him in the hospital and make a final disposition.  Traeson Dusza,LES 04/20/2014, 1:23 PM    Pryor Curia MD  CC: Dr. Netta Cedars

## 2014-04-20 NOTE — Progress Notes (Signed)
Orthopedics Progress Note  Subjective: That CPM is killing me.  Objective:  Filed Vitals:   04/20/14 1615  BP:   Pulse: 78  Temp:   Resp: 13    General: Awake and alert  Musculoskeletal: AROM 20-60 degrees. CPM removed as I could not get it adjusted properly. Dressing CDI Neurovascularly intact  Lab Results  Component Value Date   WBC 8.0 04/10/2014   HGB 15.2 04/10/2014   HCT 43.9 04/10/2014   MCV 90.5 04/10/2014   PLT 229 04/10/2014       Component Value Date/Time   NA 139 04/10/2014 1346   K 4.0 04/10/2014 1346   CL 108 04/10/2014 1346   CO2 25 04/10/2014 1346   GLUCOSE 96 04/10/2014 1346   BUN 19 04/10/2014 1346   CREATININE 1.34 04/10/2014 1346   CALCIUM 8.7 04/10/2014 1346   GFRNONAA 50* 04/10/2014 1346   GFRAA 58* 04/10/2014 1346    Lab Results  Component Value Date   INR 0.98 04/10/2014    Assessment/Plan: s/p Procedure(s): LEFT TOTAL KNEE ARTHROPLASTY CYSTOSCOPY Explained the reason for the urology consult and that the foley will be left in place for several days Mobilization with PT, Ortho Tech called to check on maybe getting a different CPM  Remo Lipps R. Veverly Fells, MD 04/20/2014 6:06 PM

## 2014-04-20 NOTE — Progress Notes (Signed)
Orthopedic Tech Progress Note Patient Details:  Jeffrey Marks 01-07-1941 [id number] Applied CPM to LLE. CPM Left Knee CPM Left Knee: On Left Knee Flexion (Degrees): 60 Left Knee Extension (Degrees): 0   [name] 04/20/2014, 8:17 PM

## 2014-04-20 NOTE — Brief Op Note (Signed)
04/20/2014  2:52 PM  PATIENT:  Jeffrey Marks  74 y.o. male  PRE-OPERATIVE DIAGNOSIS:  LEFT KNEE OA, END STAGE  POST-OPERATIVE DIAGNOSIS:  LEFT KNEE OA, END STAGE, URETHRAL STRICTURE  PROCEDURE:  Procedure(s): LEFT TOTAL KNEE ARTHROPLASTY (Left) CYSTOSCOPY  SURGEON:  Surgeon(s) and Role: Panel 1:    * Augustin Schooling, MD - Primary  Panel 2:    * Raynelle Bring, MD - Primary  PHYSICIAN ASSISTANT:   ASSISTANTS: Molli Barrows PA-C   ANESTHESIA:   spinal  EBL:  Total I/O In: 1450 [I.V.:1450] Out: 250 [Urine:250]  BLOOD ADMINISTERED:none  DRAINS: none   LOCAL MEDICATIONS USED:  NONE  SPECIMEN:  No Specimen  DISPOSITION OF SPECIMEN:  N/A  COUNTS:  YES  TOURNIQUET:   Total Tourniquet Time Documented: Thigh (Left) - 94 minutes Total: Thigh (Left) - 94 minutes   DICTATION: .Other Dictation: Dictation Number [id number]  PLAN OF CARE: Admit to inpatient   PATIENT DISPOSITION:  PACU - hemodynamically stable.   Delay start of Pharmacological VTE agent (>24hrs) due to surgical blood loss or risk of bleeding: no

## 2014-04-20 NOTE — Progress Notes (Signed)
ANTICOAGULATION CONSULT NOTE - Initial Consult  Pharmacy Consult for Warfarin Indication: VTE prophylaxis  Allergies  Allergen Reactions  . Sulfa Antibiotics Rash    Patient Measurements: Weight: 158 lb (71.668 kg)  Vital Signs: Temp: 97.8 F (36.6 C) (01/22 1600) Temp Source: Oral (01/22 0835) BP: 174/81 mmHg (01/22 1600) Pulse Rate: 78 (01/22 1615)  Labs: No results for input(s): HGB, HCT, PLT, APTT, LABPROT, INR, HEPARINUNFRC, CREATININE, CKTOTAL, CKMB, TROPONINI in the last 72 hours.  Estimated Creatinine Clearance: 46.1 mL/min (by C-G formula based on Cr of 1.34).   Medical History: Past Medical History  Diagnosis Date  . Anxiety   . Arthritis   . Cancer   . Cataract     s/p  . Thyroid disease   . Basal cell carcinoma     rt. forearm and nose  . Hypertension   . Heart murmur   . Hypothyroidism     Medications:  Facility-administered medications prior to admission  Medication Dose Route Frequency Provider Last Rate Last Dose  . 0.9 %  sodium chloride infusion  500 mL Intravenous Continuous Milus Banister, MD       Prescriptions prior to admission  Medication Sig Dispense Refill Last Dose  . ALPRAZolam (XANAX) 0.5 MG tablet Take 0.5 mg by mouth 2 (two) times daily.    04/20/2014 at [place]  . ibuprofen (ADVIL,MOTRIN) 200 MG tablet Take 400-600 mg by mouth daily as needed (pain).   Past Month at Unknown time  . levothyroxine (SYNTHROID, LEVOTHROID) 112 MCG tablet Take 112 mcg by mouth daily before breakfast.    04/20/2014 at Unknown time  . lisinopril (PRINIVIL,ZESTRIL) 20 MG tablet Take 20 mg by mouth daily.    04/19/2014 at Unknown time  . Misc Natural Products [facility]) CAPS Take 2 capsules by mouth daily. For prostate health   Past Week at Unknown time  . sildenafil (VIAGRA) 50 MG tablet Take 25 mg by mouth as needed for erectile dysfunction.      Marland Kitchen atorvastatin (LIPITOR) 10 MG tablet   1     Assessment: 74 yo M admitted 04/20/2014 after TKA.  Pharmacy  consulted to start warfarin for VTE prophylaxsis.  PMH: HTN, Hypothyroid, history of skin CA.   Coag: VTE Px post TKA.    Goal of Therapy:  INR 2-3 Monitor platelets by anticoagulation protocol: Yes   Plan:  Warfarin 7.5 mg x 1 tonight. Daily INR  Thank you for allowing pharmacy to be a part of this patients care team.  Rowe Robert Pharm.D., BCPS, AQ-Cardiology Clinical Pharmacist 04/20/2014 5:59 PM Pager: [phone] Phone: [phone]

## 2014-04-20 NOTE — Progress Notes (Signed)
Orthopedic Tech Progress Note Patient Details:  Jeffrey Marks January 24, 1941 [id number] Pt. stated that CPM was "really hurting my knee".  Dr. Veverly Fells requested that [facility] CPM be replaced with "one of the old ones."  Delivered Optiflex3 CPM and offered to place it on pt.  Pt. refused placement, stating, "I'd rather wait until morning."  Left Optiflex3 CPM in pt.'s room for use in AM.     [name] 04/20/2014, 8:10 PM

## 2014-04-20 NOTE — Transfer of Care (Signed)
Immediate Anesthesia Transfer of Care Note  Patient: Jeffrey Marks  Procedure(s) Performed: Procedure(s): LEFT TOTAL KNEE ARTHROPLASTY (Left) CYSTOSCOPY  Patient Location: PACU  Anesthesia Type:Spinal  Level of Consciousness: awake, alert , oriented and patient cooperative  Airway & Oxygen Therapy: Patient Spontanous Breathing  Post-op Assessment: Report given to PACU RN, Post -op Vital signs reviewed and stable and Patient moving all extremities  Post vital signs: Reviewed and stable  Complications: No apparent anesthesia complications

## 2014-04-20 NOTE — Anesthesia Procedure Notes (Addendum)
Anesthesia Regional Block:  Adductor canal block  Pre-Anesthetic Checklist: ,, timeout performed, Correct Patient, Correct Site, Correct Laterality, Correct Procedure, Correct Position, site marked, Risks and benefits discussed,  Surgical consent,  Pre-op evaluation,  At surgeon's request and post-op pain management  Laterality: Left  Prep: chloraprep       Needles:  Injection technique: Single-shot  Needle Type: Echogenic Stimulator Needle     Needle Length: 9cm 9 cm Needle Gauge: 22 and 22 G    Additional Needles:  Procedures: ultrasound guided (picture in chart) Adductor canal block Narrative:  Start time: 04/20/2014 10:35 AM End time: 04/20/2014 10:44 AM Injection made incrementally with aspirations every 5 mL.  Performed by: Personally   Additional Notes: 30 cc 0.5% Marcaine 1:200 epi injected easily   Procedure Name: MAC Date/Time: 04/20/2014 11:30 AM Performed by: Rush Farmer E Pre-anesthesia Checklist: Patient identified, Emergency Drugs available, Suction available, Patient being monitored and Timeout performed Patient Re-evaluated:Patient Re-evaluated prior to inductionOxygen Delivery Method: Simple face mask Intubation Type: IV induction Placement Confirmation: positive ETCO2    Spinal Patient location during procedure: OR Start time: 04/20/2014 11:18 AM End time: 04/20/2014 11:23 AM Staffing Performed by: anesthesiologist  Preanesthetic Checklist Completed: patient identified, site marked, surgical consent, pre-op evaluation, timeout performed, IV checked, risks and benefits discussed and monitors and equipment checked Spinal Block Patient position: sitting Prep: ChloraPrep Patient monitoring: heart rate, cardiac monitor, continuous pulse ox and blood pressure Approach: right paramedian Location: L4-5 Injection technique: single-shot Needle Needle type: Tuohy  Needle gauge: 22 G Needle length: 9 cm Assessment Sensory level: T10 Additional  Notes 10 mg 0.75% Marcaine injected easily

## 2014-04-20 NOTE — Op Note (Signed)
Jeffrey Marks, Jeffrey Marks NO.:  [id number]  MEDICAL RECORD NO.:  [id number]  LOCATION:  5N12C                        FACILITY:  [facility]  PHYSICIAN:  Doran Heater. Veverly Fells, M.D. DATE OF BIRTH:  12/15/1940  DATE OF PROCEDURE:  04/20/2014 DATE OF DISCHARGE:                              OPERATIVE REPORT   PREOPERATIVE DIAGNOSIS:  Left knee end-stage osteoarthritis.  POSTOPERATIVE DIAGNOSIS:  Left knee end-stage osteoarthritis and urethral stricture.  PROCEDURE PERFORMED:  Left total knee arthroplasty using DePuy Sigma rotating platform prosthesis and ureteroscopy with urethral dilation from Dr. Raynelle Bring and he did separate dictation and separate procedure.  ATTENDING SURGEON:  Doran Heater. Veverly Fells, M.D.  ASSISTANT:  Judith Part. Chabon, PA-C.  ANESTHESIA:  Spinal anesthesia was used.  ESTIMATED BLOOD LOSS:  Minimal.  FLUID REPLACEMENT:  1200 mL, crystalloid.  INSTRUMENT COUNTS:  Correct.  COMPLICATIONS:  There were no complications.  ANTIBIOTICS:  Perioperative antibiotics were given.  TOURNIQUET TIME:  94 minutes at 300 mmHg.  INDICATIONS:  The patient is a 74 year old male who has had progressive left knee pain following a knee arthroscopy.  The patient has had worsening knee arthritis in the medial and patellofemoral compartments. Patient now has inability to walk more than a block due to pain.  He ambulates with the use of assistive device and has significant pain both at rest and at night.  Discussed options of management, the patient elected to proceed with total knee arthroplasty to restore function and to eliminate pain to his knee.  Informed consent obtained.  DESCRIPTION OF PROCEDURE:  After an adequate level of spinal anesthesia was achieved, the patient was placed in supine position.  Nonsterile tourniquet placed on left proximal thigh.  We went to place a Foley catheter, were unable to do that despite using 16 and 14-French catheters as well as  Coude catheters.  We then consulted Dr. Raynelle Bring with Urology to assist [place] in obtaining placement of urinary catheter.  Dr. Alinda Money came in with ureteroscopy and was able to identify a very tight area basically near the bladder neck and was able to pass a wire and then eventually a dilator and was able to dilate and eventually placed a 16-French catheter which should be left in until Dr. Alinda Money feels it is safe to pull that out.  Once Dr. Alinda Money completed his procedure, we then placed the patient back in supine position with the right leg padded appropriately.  The left leg with nonsterile tourniquet on the proximal thigh.  Sterile prep and drape of the leg were performed, time-out called.  Elevated the leg and exsanguinated using Esmarch bandage.  We then went ahead and made a longitudinal midline incision with the knee in flexion.  Dissection down through subcutaneous tissues using a sharp 10 blade.  We identified the median parapatellar tissues and performed a parapatellar arthrotomy divided the lateral patellofemoral ligaments everting the patella.  We then entered the distal femur with a step-cut drill and then placed an intramedullary distal femoral resection guide 5 degrees set at 10 mm for the left side. Once we could resected the distal femur, we sized at a size 4 anterior down and made our  anterior, posterior, and chamfer cuts with a 4-in-1 block.  Removed the ACL, PCL, and the meniscal tissue.  We then subluxed the tibia anteriorly and performed our tibial cut 2 mm off the affected medial side with an external alignment jig perpendicular to the long axis of the tibia with minimal posterior slope to this posterior cruciate substituting prosthesis.  Once we verified our gaps, were symmetric at 12.5 mm.  We went ahead and completed our tibial preparation with a modular drill and keel punch.  We also removed excess posterior bone off the posterior femoral condyles with an  osteotome and then did our box cut with a box cut guide with an oscillating saw.  We placed our size 4 left femoral component impacted that in position, reduced the knee with a 12.5 and then a 15 polyethylene insert.  We had a better fit with that 15.  We placed the knee in full extension.  We then went ahead and resurfaced the patella, was 25 mm thick.  We took it down to 16 mm thick with the patellar cutting guide and with an oscillating saw.  We then went ahead and drilled the lugs for the 35 patellar button and then placed our real 35 patellar button in place and then trialed the knee, we found that we had outstanding range of motion and patellar tracking with a no-touch technique.  We removed all trial components, pulse irrigated the knee, dried the bone, and cemented the components into place with DePuy High Viscosity cement Vacu-Mix.  Once we had the tibial components cemented in place, size 4 tibia, size 4 left femur, we placed the knee in extension with a 15 poly insert and also placed the patellar component on and held it with patellar clamp. Once all cement was hard, then we went ahead and removed excess cement with quarter-inch curved osteotome and then trialed again with that 15, we were happy with that.  We selected real 15 poly and removed the trial components, placed a real 15 poly and had reduced the knee with a nice snap on the medial side.  We had excellent alignment and patellar tracking.  We thoroughly irrigated the knee joint and then repaired the parapatellar arthrotomy with #1 Vicryl suture followed by 2-0 Vicryl subcutaneous closure and 4-0 Monocryl for skin.  Steri-Strips and sterile dressing applied.  The patient tolerated the surgery well.     Doran Heater. Veverly Fells, M.D.     SRN/MEDQ  D:  04/20/2014  T:  04/20/2014  Job:  [id number]

## 2014-04-20 NOTE — Anesthesia Preprocedure Evaluation (Signed)
Anesthesia Evaluation  Patient identified by MRN, date of birth, ID band Patient awake    Reviewed: Allergy & Precautions, NPO status , Patient's Chart, lab work & pertinent test results  Airway Mallampati: II  TM Distance: >3 FB Neck ROM: Full    Dental  (+) Teeth Intact, Dental Advisory Given   Pulmonary former smoker,  breath sounds clear to auscultation        Cardiovascular hypertension, Rhythm:Regular Rate:Normal     Neuro/Psych    GI/Hepatic   Endo/Other    Renal/GU      Musculoskeletal   Abdominal   Peds  Hematology   Anesthesia Other Findings   Reproductive/Obstetrics                             Anesthesia Physical Anesthesia Plan  ASA: II  Anesthesia Plan: Spinal   Post-op Pain Management: MAC Combined w/ Regional for Post-op pain   Induction: Intravenous  Airway Management Planned: Simple Face Mask and Natural Airway  Additional Equipment:   Intra-op Plan:   Post-operative Plan:   Informed Consent: I have reviewed the patients History and Physical, chart, labs and discussed the procedure including the risks, benefits and alternatives for the proposed anesthesia with the patient or authorized representative who has indicated his/her understanding and acceptance.   Dental advisory given  Plan Discussed with: CRNA and Anesthesiologist  Anesthesia Plan Comments: (Hypertension BPH  Plan SAB with adductor canal block  Roberts Gaudy)        Anesthesia Quick Evaluation

## 2014-04-20 NOTE — Anesthesia Postprocedure Evaluation (Signed)
  Anesthesia Post-op Note  Patient: Jeffrey Marks  Procedure(s) Performed: Procedure(s): LEFT TOTAL KNEE ARTHROPLASTY (Left) CYSTOSCOPY  Patient Location: PACU  Anesthesia Type:Regional and Spinal  Level of Consciousness: awake, alert  and oriented  Airway and Oxygen Therapy: Patient Spontanous Breathing  Post-op Pain: mild  Post-op Assessment: Post-op Vital signs reviewed, Patient's Cardiovascular Status Stable, Respiratory Function Stable, Patent Airway and Pain level controlled  Post-op Vital Signs: stable  Last Vitals:  Filed Vitals:   04/20/14 1615  BP:   Pulse: 78  Temp:   Resp: 13    Complications: No apparent anesthesia complications

## 2014-04-20 NOTE — Interval H&P Note (Signed)
History and Physical Interval Note:  04/20/2014 10:55 AM  Staci Acosta  has presented today for surgery, with the diagnosis of LEFT KNEE OA  The various methods of treatment have been discussed with the patient and family. After consideration of risks, benefits and other options for treatment, the patient has consented to  Procedure(s): LEFT TOTAL KNEE ARTHROPLASTY (Left) as a surgical intervention .  The patient's history has been reviewed, patient examined, no change in status, stable for surgery.  I have reviewed the patient's chart and labs.  Questions were answered to the patient's satisfaction.     Taiylor Virden,STEVEN R

## 2014-04-21 LAB — CBC
HEMATOCRIT: 34.1 % — AB (ref 39.0–52.0)
Hemoglobin: 11.8 g/dL — ABNORMAL LOW (ref 13.0–17.0)
MCH: 31.1 pg (ref 26.0–34.0)
MCHC: 34.6 g/dL (ref 30.0–36.0)
MCV: 89.7 fL (ref 78.0–100.0)
Platelets: 211 10*3/uL (ref 150–400)
RBC: 3.8 MIL/uL — AB (ref 4.22–5.81)
RDW: 12.5 % (ref 11.5–15.5)
WBC: 13.6 10*3/uL — ABNORMAL HIGH (ref 4.0–10.5)

## 2014-04-21 LAB — BASIC METABOLIC PANEL
ANION GAP: 12 (ref 5–15)
BUN: 15 mg/dL (ref 6–23)
CHLORIDE: 107 mmol/L (ref 96–112)
CO2: 19 mmol/L (ref 19–32)
CREATININE: 1.19 mg/dL (ref 0.50–1.35)
Calcium: 8 mg/dL — ABNORMAL LOW (ref 8.4–10.5)
GFR calc Af Amer: 67 mL/min — ABNORMAL LOW (ref >90)
GFR calc non Af Amer: 58 mL/min — ABNORMAL LOW (ref >90)
Glucose, Bld: 163 mg/dL — ABNORMAL HIGH (ref 70–99)
Potassium: 3.8 mmol/L (ref 3.5–5.1)
SODIUM: 138 mmol/L (ref 135–145)

## 2014-04-21 LAB — PROTIME-INR
INR: 1.22 (ref 0.00–1.49)
Prothrombin Time: 15.6 seconds — ABNORMAL HIGH (ref 11.6–15.2)

## 2014-04-21 MED ORDER — WARFARIN SODIUM 7.5 MG PO TABS
7.5000 mg | ORAL_TABLET | Freq: Once | ORAL | Status: AC
  Administered 2014-04-21: 7.5 mg via ORAL
  Filled 2014-04-21: qty 1

## 2014-04-21 MED ORDER — COUMADIN BOOK
Freq: Once | Status: AC
  Administered 2014-04-21: 15:00:00
  Filled 2014-04-21: qty 1

## 2014-04-21 MED ORDER — WARFARIN VIDEO: Freq: Once | Status: DC

## 2014-04-21 NOTE — Progress Notes (Signed)
Orthopedic Tech Progress Note Patient Details:  Jeffrey Marks 01/15/1941 [id number] On cpm at 7:30 pm Patient ID: Jeffrey Marks, male   DOB: 08-09-1940, 74 y.o.   MRN: [id number]   [name] 04/21/2014, 7:29 PM

## 2014-04-21 NOTE — Progress Notes (Signed)
    Subjective: 1 Day Post-Op Procedure(s) (LRB): LEFT TOTAL KNEE ARTHROPLASTY (Left) CYSTOSCOPY Patient reports pain as 5 on 0-10 scale.   Denies CP or SOB.  Voiding without difficulty. Positive flatus. Objective: Vital signs in last 24 hours: Temp:  [97.4 F (36.3 C)-98.6 F (37 C)] 98.5 F (36.9 C) (01/23 0533) Pulse Rate:  [69-115] 104 (01/23 0533) Resp:  [11-28] 18 (01/23 0533) BP: (124-201)/(64-130) 124/64 mmHg (01/23 0533) SpO2:  [92 %-100 %] 96 % (01/23 0533) Weight:  [71.668 kg (158 lb)] 71.668 kg (158 lb) (01/22 0835)  Intake/Output from previous day: 01/22 0701 - 01/23 0700 In: 3033.8 [P.O.:815; I.V.:2218.8] Out: 1050 [Urine:1050] Intake/Output this shift:    Labs:  Recent Labs  04/21/14 0415  HGB 11.8*    Recent Labs  04/21/14 0415  WBC 13.6*  RBC 3.80*  HCT 34.1*  PLT 211    Recent Labs  04/21/14 0415  NA 138  K 3.8  CL 107  CO2 19  BUN 15  CREATININE 1.19  GLUCOSE 163*  CALCIUM 8.0*    Recent Labs  04/21/14 0415  INR 1.22    Physical Exam: Neurologically intact ABD soft Intact pulses distally Dorsiflexion/Plantar flexion intact Compartment soft  Assessment/Plan: 1 Day Post-Op Procedure(s) (LRB): LEFT TOTAL KNEE ARTHROPLASTY (Left) CYSTOSCOPY Advance diet Up with therapy D/C IV fluids  Dressing change in AM Progress per protocol    Oakes Mccready D for Dr. Melina Schools [facility] [phone] 04/21/2014, 8:25 AM

## 2014-04-21 NOTE — Progress Notes (Signed)
Patient ID: Jeffrey Marks, male   DOB: 05-18-1940, 74 y.o.   MRN: [id number]  1 Day Post-Op Subjective: Pt s/p balloon dilation of bladder neck stenosis (h/o TURP) at time of TKA yesterday.  He has been doing well.  No pain with cathter.  Objective: Vital signs in last 24 hours: Temp:  [97.4 F (36.3 C)-98.6 F (37 C)] 98.5 F (36.9 C) (01/23 0533) Pulse Rate:  [69-115] 104 (01/23 0533) Resp:  [11-28] 18 (01/23 0533) BP: (124-201)/(64-130) 124/64 mmHg (01/23 0533) SpO2:  [92 %-100 %] 96 % (01/23 0533) Weight:  [71.668 kg (158 lb)] 71.668 kg (158 lb) (01/22 0835)  Intake/Output from previous day: 01/22 0701 - 01/23 0700 In: 3033.8 [P.O.:815; I.V.:2218.8] Out: 1050 [Urine:1050] Intake/Output this shift:    Physical Exam:  General: Alert and oriented GU: Urine is grossly clear  Lab Results:   Assessment/Plan: 1) Bladder neck stenosis s/p balloon dilation: Will have patient discharged with his catheter and leave indwelling for about 1 week.  Will arrange outpatient f/u for voiding trial.  Pt's most recent primary urologist is actually Dr. Jonette Eva in [facility] although he expressed his desire to follow up in [facility] for convenience. I will arrange this.  Will begin catheter teaching for him and his wife. Please call if further questions during his hospitalization.   LOS: 1 day   Ike Maragh,LES 04/21/2014, 7:38 AM

## 2014-04-21 NOTE — Progress Notes (Signed)
ANTICOAGULATION CONSULT NOTE - Follow-up Consult  Pharmacy Consult for Warfarin Indication: VTE prophylaxis  Allergies  Allergen Reactions  . Sulfa Antibiotics Rash    Patient Measurements: Weight: 158 lb (71.668 kg)  Vital Signs: Temp: 99 F (37.2 C) (01/23 1351) Temp Source: Oral (01/23 1351) BP: 129/62 mmHg (01/23 1351) Pulse Rate: 98 (01/23 1351)  Labs:  Recent Labs  04/21/14 0415  HGB 11.8*  HCT 34.1*  PLT 211  LABPROT 15.6*  INR 1.22  CREATININE 1.19    Estimated Creatinine Clearance: 51.9 mL/min (by C-G formula based on Cr of 1.19).   Assessment: 74 yo M admitted 04/20/2014 after TKA. Pt on coumadin for VTE prophylaxis. INR 1.22 (up nicely past first dose of coumadin (baseline of 0.98 on 1/12). CBC stable.  Goal of Therapy:  INR 2-3 Monitor platelets by anticoagulation protocol: Yes   Plan:  Warfarin 7.5 mg again tonight. Daily INR Coumadin educational book and video  Thank you for allowing pharmacy to be a part of this patients care team.  Sherlon Handing, PharmD, BCPS Clinical pharmacist, pager [phone] 04/21/2014 2:11 PM

## 2014-04-21 NOTE — Evaluation (Signed)
Physical Therapy Evaluation Patient Details Name: Jeffrey Marks MRN: [id number] DOB: [birth year] Today's Date: 04/21/2014   History of Present Illness  74 yo male with new TKA on LLE was admitted with failed OA and PMHx:  HTN, hypothyroidism, heart murmur  Clinical Impression  Pt was seen for initial visit with a new TKA LLE and noted pulse 119 starting mobility but 150 after 60'.  Nursing notified but pulse down to 108 fairly quickly.  Has handout covered and initiated exercises and precaution information.  HHPT planned and will need RW.    Follow Up Recommendations Home health PT;Supervision/Assistance - 24 hour    Equipment Recommendations  Rolling walker with 5" wheels;3in1 (PT)    Recommendations for Other Services       Precautions / Restrictions Precautions Precautions: Knee Precaution Booklet Issued: Yes (comment) Precaution Comments: Instructed pt and reviewed there ex Required Braces or Orthoses: Knee Immobilizer - Left Knee Immobilizer - Left: Discontinue once straight leg raise with < 10 degree lag;On when out of bed or walking Restrictions Weight Bearing Restrictions: Yes LLE Weight Bearing: Weight bearing as tolerated      Mobility  Bed Mobility Overal bed mobility: Needs Assistance Bed Mobility: Supine to Sit;Sidelying to Sit   Sidelying to sit: Min assist Supine to sit: Min assist     General bed mobility comments: Pt using HOB elevated and then using rails  Transfers Overall transfer level: Needs assistance Equipment used: Rolling walker (2 wheeled);1 person hand held assist Transfers: Sit to/from [facility] Sit to Stand: Min assist;From elevated surface Stand pivot transfers: Min guard;Min assist       General transfer comment: Min to mod to sit down with reminders for hand placement  Ambulation/Gait Ambulation/Gait assistance: Min assist Ambulation Distance (Feet): 50 Feet Assistive device: Rolling walker (2 wheeled) Gait  Pattern/deviations: Step-to pattern;Decreased step length - right;Decreased step length - left;Decreased stance time - left;Decreased stride length;Decreased dorsiflexion - left;Decreased weight shift to left;Wide base of support Gait velocity: reduced Gait velocity interpretation: Below normal speed for age/gender General Gait Details: slow pace with slightly flexed posture over walker  Stairs            Wheelchair Mobility    Modified Rankin (Stroke Patients Only)       Balance Overall balance assessment: Needs assistance Sitting-balance support: Feet supported;Bilateral upper extremity supported Sitting balance-Leahy Scale: Good   Postural control: Posterior lean Standing balance support: Bilateral upper extremity supported Standing balance-Leahy Scale: Poor Standing balance comment: reduced control over surgery knee                             Pertinent Vitals/Pain Pain Assessment: 0-10 Pain Score: 5  Pain Location: L knee Pain Intervention(s): Limited activity within patient's tolerance;Monitored during session;Premedicated before session;Repositioned;Other (comment) (elevated HR despite pt reporting pain good during gait)    Home Living Family/patient expects to be discharged to:: Private residence Living Arrangements: Spouse/significant other Available Help at Discharge: Family;Available 24 hours/day Type of Home: House Home Access: Level entry     Home Layout: One level Home Equipment: Crutches;Shower seat - built in      Prior Function Level of Independence: Independent               Hand Dominance        Extremity/Trunk Assessment   Upper Extremity Assessment: Overall WFL for tasks assessed  Lower Extremity Assessment: LLE deficits/detail   LLE Deficits / Details: new TKA  Cervical / Trunk Assessment: Normal  Communication   Communication: No difficulties  Cognition Arousal/Alertness: Awake/alert Behavior  During Therapy: WFL for tasks assessed/performed Overall Cognitive Status: Within Functional Limits for tasks assessed                      General Comments General comments (skin integrity, edema, etc.): Pt is wearing an ace wrap over his L knee and cannot assess his edema or skin    Exercises Total Joint Exercises Ankle Circles/Pumps: AROM;Both;5 reps Quad Sets: AROM;Both;5 reps Gluteal Sets: AROM;Both;5 reps Towel Squeeze: AROM;Both;5 reps Goniometric ROM: ext -10       Assessment/Plan    PT Assessment Patient needs continued PT services  PT Diagnosis Difficulty walking;Acute pain   PT Problem List Decreased strength;Decreased range of motion;Decreased activity tolerance;Decreased balance;Decreased mobility;Decreased coordination;Decreased knowledge of use of DME;Decreased knowledge of precautions;Cardiopulmonary status limiting activity;Decreased skin integrity;Pain  PT Treatment Interventions Gait training;Stair training;Functional mobility training;Therapeutic activities;Therapeutic exercise;Balance training;Neuromuscular re-education;Patient/family education;DME instruction   PT Goals (Current goals can be found in the Care Plan section) Acute Rehab PT Goals Patient Stated Goal: to get home PT Goal Formulation: With patient/family Time For Goal Achievement: 05/05/14 Potential to Achieve Goals: Good    Frequency 7X/week   Barriers to discharge Other (comment) (limited tolerance for mobility with HR)      Co-evaluation               End of Session Equipment Utilized During Treatment: Other (comment) (FWW) Activity Tolerance: Patient tolerated treatment well;Other (comment) (elevated HR caused gait to be shortened as it was 150 sudden) Patient left: in chair;with call bell/phone within reach;with family/visitor present Nurse Communication: Mobility status;Other (comment) (HR)         Time: 1010-1035 PT Time Calculation (min) (ACUTE ONLY): 25  min   Charges:   PT Evaluation $Initial PT Evaluation Tier I: 1 Procedure PT Treatments $Gait Training: 8-22 mins   PT G Codes:        Ramond Dial 05/12/2014, 11:25 AM   Mee Hives, PT MS Acute Rehab Dept. Number: [phone]

## 2014-04-22 LAB — CBC
HCT: 28.4 % — ABNORMAL LOW (ref 39.0–52.0)
Hemoglobin: 10 g/dL — ABNORMAL LOW (ref 13.0–17.0)
MCH: 32.1 pg (ref 26.0–34.0)
MCHC: 35.2 g/dL (ref 30.0–36.0)
MCV: 91 fL (ref 78.0–100.0)
Platelets: 159 10*3/uL (ref 150–400)
RBC: 3.12 MIL/uL — AB (ref 4.22–5.81)
RDW: 12.8 % (ref 11.5–15.5)
WBC: 12 10*3/uL — ABNORMAL HIGH (ref 4.0–10.5)

## 2014-04-22 LAB — PROTIME-INR
INR: 2.81 — ABNORMAL HIGH (ref 0.00–1.49)
Prothrombin Time: 29.8 seconds — ABNORMAL HIGH (ref 11.6–15.2)

## 2014-04-22 MED ORDER — WARFARIN SODIUM 5 MG PO TABS: 5.0000 mg | ORAL_TABLET | Freq: Every day | ORAL | Status: AC

## 2014-04-22 NOTE — Progress Notes (Signed)
   Subjective: 2 Days Post-Op Procedure(s) (LRB): LEFT TOTAL KNEE ARTHROPLASTY (Left) CYSTOSCOPY Patient reports pain as mild.   Patient seen in rounds with Dr. Wynelle Link.  Wife in room at bedside. Doing well this morning. Patient is well, and has had no acute complaints or problems Plan is to go Home after hospital stay.   The patient wants to go home today.  Objective: Vital signs in last 24 hours: Temp:  [98.9 F (37.2 C)-99.4 F (37.4 C)] 99.4 F (37.4 C) (01/24 0456) Pulse Rate:  [98-111] 109 (01/24 0456) Resp:  [18] 18 (01/24 0456) BP: (129-141)/(62-70) 140/70 mmHg (01/24 0456) SpO2:  [94 %-96 %] 95 % (01/24 0456)  Intake/Output from previous day:  Intake/Output Summary (Last 24 hours) at 04/22/14 0853 Last data filed at 04/22/14 0459  Gross per 24 hour  Intake    720 ml  Output    450 ml  Net    270 ml    Labs:  Recent Labs  04/21/14 0415 04/22/14 0507  HGB 11.8* 10.0*    Recent Labs  04/21/14 0415 04/22/14 0507  WBC 13.6* 12.0*  RBC 3.80* 3.12*  HCT 34.1* 28.4*  PLT 211 159    Recent Labs  04/21/14 0415  NA 138  K 3.8  CL 107  CO2 19  BUN 15  CREATININE 1.19  GLUCOSE 163*  CALCIUM 8.0*    Recent Labs  04/21/14 0415 04/22/14 0507  INR 1.22 2.81*    EXAM General - Patient is Alert, Appropriate and Oriented Extremity - Neurovascular intact Sensation intact distally Dorsiflexion/Plantar flexion intact Dressing/Incision - clean, dry, no drainage Motor Function - intact, moving foot and toes well on exam.   Past Medical History  Diagnosis Date  . Anxiety   . Arthritis   . Cancer   . Cataract     s/p  . Thyroid disease   . Basal cell carcinoma     rt. forearm and nose  . Hypertension   . Heart murmur   . Hypothyroidism     Assessment/Plan: 2 Days Post-Op Procedure(s) (LRB): LEFT TOTAL KNEE ARTHROPLASTY (Left) CYSTOSCOPY Active Problems:   S/P knee replacement  Estimated body mass index is 24.03 kg/(m^2) as calculated  from the following:   Height as of 04/10/14: 5\' 8"  (1.727 m).   Weight as of this encounter: 71.668 kg (158 lb). Up with therapy Discharge home with home health  DVT Prophylaxis - Coumadin Weight-Bearing as tolerated to left leg Patient wants to go home today. DC home F/U in two weeks Disposition - home COD - Improved  Foley catherter in place.  Will leave in at discharge and follow up in one week.  He wants to follow up with Dr. Alinda Money.  Recommend in one week for voiding trial.  Arlee Muslim, PA-C Orthopaedic Surgery 04/22/2014, 8:53 AM

## 2014-04-22 NOTE — Discharge Instructions (Signed)
Ice to the knee at all times.  Please Do NOT place anything behind the knee.  Prop under the ankle to encourage extension while resting.  CPM 0-60 degrees, increase 10 degrees per day.  6-8 hours per day in two hour blocks.  Use the knee immobilizer only at night to maintain extension.  WBAT  Follow up in two weeks in the office  [phone]

## 2014-04-22 NOTE — Progress Notes (Signed)
CARE MANAGEMENT NOTE 04/22/2014  Patient:  Jeffrey Marks, Jeffrey Marks   Account Number:  [id number]  Date Initiated:  04/22/2014  Documentation initiated by:  [facility]  Subjective/Objective Assessment:   LEFT TOTAL KNEE ARTHROPLASTY     Action/Plan:   Anticipated DC Date:  04/22/2014   Anticipated DC Plan:  [place]  CM consult      Choice offered to / List presented to:          [facility] arranged  HH-1 RN  [place] agency  [facility]   Status of service:  Completed, signed off Medicare Important Message given?  NA - LOS <3 / Initial given by admissions (If response is "NO", the following Medicare IM given date fields will be blank) Date Medicare IM given:   Medicare IM given by:   Date Additional Medicare IM given:   Additional Medicare IM given by:    Discharge Disposition:  [place]  Per UR Regulation:    If discussed at Long Length of Stay Meetings, dates discussed:    Comments:  04/22/2014 [place] to make aware of dc home with [facility] RN and PT. Will need PT/INR lab draws. Jonnie Finner RN CCM Case Mgmt phone [phone]

## 2014-04-22 NOTE — Progress Notes (Signed)
02/21/2015 1555 NCM attempted call to pt home. Spoke to pt's wife. Pt has RW at home. Jonnie Finner RN CCM Case Mgmt phone [phone]

## 2014-04-22 NOTE — Progress Notes (Signed)
Physical Therapy Treatment Patient Details Name: Jeffrey Marks MRN: [id number] DOB: 1941-02-22 Today's Date: 04/22/2014    History of Present Illness 74 yo male s/p L TKA with PMHx:  HTN, hypothyroidism, heart murmur    PT Comments    Patient making good gains with mobility and gait.  To have f/u HHPT at discharge.  Follow Up Recommendations  Home health PT;Supervision/Assistance - 24 hour     Equipment Recommendations  Rolling walker with 5" wheels;3in1 (PT)    Recommendations for Other Services       Precautions / Restrictions Precautions Precautions: Knee Precaution Comments: Reinforced knee precautions Required Braces or Orthoses: Knee Immobilizer - Left Knee Immobilizer - Left: On when out of bed or walking;Discontinue once straight leg raise with < 10 degree lag Restrictions Weight Bearing Restrictions: Yes LLE Weight Bearing: Weight bearing as tolerated    Mobility  Bed Mobility                  Transfers Overall transfer level: Needs assistance Equipment used: Rolling walker (2 wheeled) Transfers: Sit to/from Stand Sit to Stand: Supervision         General transfer comment: Patient using correct technique.  Assist for balance/safety.  Ambulation/Gait Ambulation/Gait assistance: Supervision Ambulation Distance (Feet): 110 Feet Assistive device: Rolling walker (2 wheeled) Gait Pattern/deviations: Step-to pattern;Decreased stance time - left;Decreased step length - right;Decreased weight shift to left;Antalgic;Trunk flexed Gait velocity: reduced Gait velocity interpretation: Below normal speed for age/gender General Gait Details: Demonstrates safe use of RW.  Cues to stand upright - flexed posture.   Stairs            Wheelchair Mobility    Modified Rankin (Stroke Patients Only)       Balance                                    Cognition Arousal/Alertness: Awake/alert Behavior During Therapy: WFL for tasks  assessed/performed Overall Cognitive Status: Within Functional Limits for tasks assessed                      Exercises Total Joint Exercises Ankle Circles/Pumps: AROM;Both;10 reps;Seated Quad Sets: AROM;Left;10 reps;Seated Heel Slides: AAROM;Left;5 reps;Seated Hip ABduction/ADduction: AROM;Left;10 reps;Seated Long Arc Quad: AROM;Left;5 reps;Seated Knee Flexion: AROM;Left;5 reps;Seated Goniometric ROM: -10* to 75*    General Comments        Pertinent Vitals/Pain Pain Assessment: 0-10 Pain Score: 2  Pain Location: Lt knee Pain Descriptors / Indicators: Sore Pain Intervention(s): Monitored during session;Repositioned    Home Living                      Prior Function            PT Goals (current goals can now be found in the care plan section) Progress towards PT goals: Progressing toward goals    Frequency  7X/week    PT Plan Current plan remains appropriate    Co-evaluation             End of Session Equipment Utilized During Treatment: Gait belt;Left knee immobilizer Activity Tolerance: Patient tolerated treatment well Patient left: in chair;with call bell/phone within reach;with family/visitor present     Time: 3382-5053 PT Time Calculation (min) (ACUTE ONLY): 25 min  Charges:  $Gait Training: 8-22 mins $Therapeutic Exercise: 8-22 mins  G CodesDespina Pole 04/22/2014, 6:57 PM Carita Pian. Sanjuana Kava, [place] Pager [phone]

## 2014-04-22 NOTE — Evaluation (Signed)
Occupational Therapy Evaluation Patient Details Name: Jeffrey Marks MRN: [id number] DOB: 1940/06/22 Today's Date: 04/22/2014    History of Present Illness 74 yo male s/p L TKA with PMHx:  HTN, hypothyroidism, heart murmur   Clinical Impression   PTA pt lived at home and was independent with ADLs. Pt currently at min guard level for functional mobility and requires assist for LB ADLs due to decreased ROM. Pt and wife completed all education and training for safety and independence with ADLs. No further acute OT needs. Pt hopes to d/c home today.     Follow Up Recommendations  No OT follow up;Supervision/Assistance - 24 hour    Equipment Recommendations  None recommended by OT    Recommendations for Other Services       Precautions / Restrictions Precautions Precautions: Knee Precaution Comments: Reinforced knee precautions Required Braces or Orthoses: Knee Immobilizer - Left Knee Immobilizer - Left: Discontinue once straight leg raise with < 10 degree lag;On when out of bed or walking Restrictions Weight Bearing Restrictions: Yes LLE Weight Bearing: Weight bearing as tolerated      Mobility Bed Mobility Overal bed mobility: Needs Assistance Bed Mobility: Supine to Sit   Sidelying to sit: Min guard;HOB elevated       General bed mobility comments: Educated pt/wife on assisting pt with LLE via [place]. Pt with use of bed rail.   Transfers Overall transfer level: Needs assistance Equipment used: Rolling walker (2 wheeled);1 person hand held assist Transfers: Sit to/from Stand Sit to Stand: Min guard         General transfer comment: Min guard for balance. No physical assist needed. VC's to pause before standing.     Balance Overall balance assessment: Needs assistance Sitting-balance support: No upper extremity supported;Feet supported Sitting balance-Leahy Scale: Good     Standing balance support: Bilateral upper extremity supported;During functional  activity Standing balance-Leahy Scale: Fair Standing balance comment: Pt able to remove Bil UEs for grooming briefly, however requires UE support for dynamic balance.                             ADL Overall ADL's : Needs assistance/impaired Eating/Feeding: Independent;Sitting   Grooming: Min guard;Standing   Upper Body Bathing: Set up;Sitting   Lower Body Bathing: Minimal assistance;Sit to/from stand   Upper Body Dressing : Set up;Sitting   Lower Body Dressing: Moderate assistance;Sit to/from stand Lower Body Dressing Details (indicate cue type and reason): (A) to reach LLE. Pt's wife available to assist at d/c.  Toilet Transfer: Min guard;Ambulation;RW;Comfort height toilet;Grab bars Toilet Transfer Details (indicate cue type and reason): Practiced on comfort height toilet to simulate home environment and determine need for BSC. Pt has a counter beside the toilet that he can use to stabilize. Educated pt on not pulling on RW to sit<>stand.  Toileting- Water quality scientist and Hygiene: Min guard;Sit to/from Nurse, children's Details (indicate cue type and reason): Educated pt and wife on safe shower transfer. Pt does not plan to shower until catheter is removed. Pt will plan to sponge bathe.  Functional mobility during ADLs: Min guard;Rolling walker General ADL Comments: Pt progressing well and has assist from his wife at d/c. Educated pt and wife in compensatory techniques for LB ADLs and safety with functional mobility. Pt practiced toilet transfer from low commode and ambulated in hallway to address activity tolerance and endurance. Pt plans to d/c home today.  Vision  Pt reports no change from baseline.                    Perception Perception Perception Tested?: No   Praxis Praxis Praxis tested?: Within functional limits    Pertinent Vitals/Pain Pain Assessment: 0-10 Pain Score: 2  Pain Location: L knee Pain Descriptors / Indicators:  Aching Pain Intervention(s): Limited activity within patient's tolerance;Monitored during session;Repositioned     Hand Dominance     Extremity/Trunk Assessment Upper Extremity Assessment Upper Extremity Assessment: Overall WFL for tasks assessed   Lower Extremity Assessment Lower Extremity Assessment: Defer to PT evaluation   Cervical / Trunk Assessment Cervical / Trunk Assessment: Normal   Communication Communication Communication: No difficulties   Cognition Arousal/Alertness: Awake/alert Behavior During Therapy: WFL for tasks assessed/performed Overall Cognitive Status: Within Functional Limits for tasks assessed                     General Comments    Educated on donning/doffing KI and sequencing with clothing. Pt will be d/c with foley catheter and provided education on keeping cath below bladder level, threading through leg hole of pants and hooking on RW.             Home Living Family/patient expects to be discharged to:: Private residence Living Arrangements: Spouse/significant other Available Help at Discharge: Family;Available 24 hours/day Type of Home: House Home Access: Level entry     Home Layout: One level     Bathroom Shower/Tub: Occupational psychologist: Standard     Home Equipment: Crutches;Shower seat - built in          Prior Functioning/Environment Level of Independence: Independent             OT Diagnosis: Generalized weakness;Acute pain    End of Session Equipment Utilized During Treatment: Gait belt;Rolling walker;Left knee immobilizer CPM Left Knee CPM Left Knee: Off  Activity Tolerance: Patient tolerated treatment well Patient left: in chair;with call bell/phone within reach;with family/visitor present   Time: 1224-8250 OT Time Calculation (min): 32 min Charges:  OT General Charges $OT Visit: 1 Procedure OT Evaluation $Initial OT Evaluation Tier I: 1 Procedure OT Treatments $Self Care/Home  Management : 8-22 mins G-Codes:    Juluis Rainier 2014-05-14, 10:06 AM  Cyndie Chime, OTR/L Occupational Therapist [phone] (pager)

## 2014-04-23 ENCOUNTER — Encounter (HOSPITAL_COMMUNITY): Payer: Self-pay | Admitting: Orthopedic Surgery

## 2014-04-25 ENCOUNTER — Emergency Department (HOSPITAL_COMMUNITY)
Admission: EM | Admit: 2014-04-25 | Discharge: 2014-04-26 | Disposition: A | Discharge status: Home / Self Care | Payer: Medicare Other | Attending: Emergency Medicine | Admitting: Emergency Medicine

## 2014-04-25 ENCOUNTER — Encounter (HOSPITAL_COMMUNITY): Payer: Self-pay | Admitting: *Deleted

## 2014-04-25 DIAGNOSIS — Z859 Personal history of malignant neoplasm, unspecified: Secondary | ICD-10-CM | POA: Insufficient documentation

## 2014-04-25 DIAGNOSIS — Z79899 Other long term (current) drug therapy: Secondary | ICD-10-CM | POA: Diagnosis not present

## 2014-04-25 DIAGNOSIS — Z87891 Personal history of nicotine dependence: Secondary | ICD-10-CM | POA: Insufficient documentation

## 2014-04-25 DIAGNOSIS — I10 Essential (primary) hypertension: Secondary | ICD-10-CM | POA: Diagnosis not present

## 2014-04-25 DIAGNOSIS — F419 Anxiety disorder, unspecified: Secondary | ICD-10-CM | POA: Insufficient documentation

## 2014-04-25 DIAGNOSIS — T45515A Adverse effect of anticoagulants, initial encounter: Secondary | ICD-10-CM

## 2014-04-25 DIAGNOSIS — Z96652 Presence of left artificial knee joint: Secondary | ICD-10-CM | POA: Diagnosis not present

## 2014-04-25 DIAGNOSIS — Z85828 Personal history of other malignant neoplasm of skin: Secondary | ICD-10-CM | POA: Insufficient documentation

## 2014-04-25 DIAGNOSIS — R011 Cardiac murmur, unspecified: Secondary | ICD-10-CM | POA: Insufficient documentation

## 2014-04-25 DIAGNOSIS — Z7901 Long term (current) use of anticoagulants: Secondary | ICD-10-CM | POA: Insufficient documentation

## 2014-04-25 DIAGNOSIS — D6832 Hemorrhagic disorder due to extrinsic circulating anticoagulants: Secondary | ICD-10-CM | POA: Diagnosis present

## 2014-04-25 DIAGNOSIS — E039 Hypothyroidism, unspecified: Secondary | ICD-10-CM | POA: Insufficient documentation

## 2014-04-25 DIAGNOSIS — M25462 Effusion, left knee: Secondary | ICD-10-CM | POA: Insufficient documentation

## 2014-04-25 DIAGNOSIS — Z8669 Personal history of other diseases of the nervous system and sense organs: Secondary | ICD-10-CM | POA: Diagnosis not present

## 2014-04-25 NOTE — ED Notes (Signed)
Pt. Was DC'd from the hospital Sunday following a left knee replacement. Pt had a check up today and was told by MD to come to the hospital for eval due to vitamin k deficiency.

## 2014-04-25 NOTE — ED Provider Notes (Signed)
CSN: [id number]     Arrival date & time 04/25/14  2344 History  This chart was scribed for Delora Fuel, MD by Mercy Moore, ED scribe.  This patient was seen in room D32C/D32C and the patient's care was started at 12:01 AM.  Chief Complaint  Patient presents with  . Post-op Problem   The history is provided by the patient. No language interpreter was used.   HPI Comments: Jeffrey Marks is a 74 y.o. male with PMHx including cancer, hypertension, heart murmur, hypothyroidism brought in by ambulance, who presents to the Emergency Department status post left total knee arthroplasty performed 04/20/2014. Patient spend two in the hospital following his surgery, without complications. Patient discharged with Robaxin, Oxycodone and Coumadin. Patient reports that a nurse from home health care service can to his home to evaluate him. Patient reports that around 10pm tonight (two hours ago), he received a call informing him that his platelet cound was greater than a 100 and that he should report to the hospital immediately. Patient states that the nurse has no trouble drawing blood from his arm.  Patient denies complications with his surgery. Patient has begun PT at his home.  PCP: Chesley Noon, MD   Past Medical History  Diagnosis Date  . Anxiety   . Arthritis   . Cancer   . Cataract     s/p  . Thyroid disease   . Basal cell carcinoma     rt. forearm and nose  . Hypertension   . Heart murmur   . Hypothyroidism    Past Surgical History  Procedure Laterality Date  . Basal cell carcinoma excision      removal -rt forearm and nose  . Arthroscopy knee w/ drilling      rt. knee  . Thryoid biopsy      2006  . Transurethral resection of prostate      2009  . Eye surgery      cataracts  . Colon surgery      denies has had colonoscopy  . Total knee arthroplasty Left 04/20/2014    Procedure: LEFT TOTAL KNEE ARTHROPLASTY;  Surgeon: Augustin Schooling, MD;  Location: [facility];  Service: Orthopedics;   Laterality: Left;  . Cystoscopy  04/20/2014    Procedure: CYSTOSCOPY;  Surgeon: Raynelle Bring, MD;  Location: [place];  Service: Urology;;  . Joint replacement     Family History  Problem Relation Age of Onset  . Stomach cancer Mother   . Colon cancer Neg Hx    History  Substance Use Topics  . Smoking status: Former Smoker    Types: Cigarettes    Quit date: 06/02/2010  . Smokeless tobacco: Never Used  . Alcohol Use: 12.0 oz/week    20 Glasses of wine per week    Review of Systems  Constitutional: Negative for fever and chills.  Musculoskeletal: Positive for joint swelling.  Skin:       Surgical incision  Hematological: Does not bruise/bleed easily.   Allergies  Sulfa antibiotics  Home Medications   Prior to Admission medications   Medication Sig Start Date End Date Taking? Authorizing Provider  ALPRAZolam Duanne Moron) 0.5 MG tablet Take 0.5 mg by mouth 2 (two) times daily.  04/03/14   Historical Provider, MD  levothyroxine (SYNTHROID, LEVOTHROID) 112 MCG tablet Take 112 mcg by mouth daily before breakfast.  12/26/13   Historical Provider, MD  lisinopril (PRINIVIL,ZESTRIL) 20 MG tablet Take 20 mg by mouth daily.  11/10/13   Historical  Provider, MD  methocarbamol (ROBAXIN) 500 MG tablet Take 1 tablet (500 mg total) by mouth 3 (three) times daily as needed. 04/20/14   Augustin Schooling, MD  Misc Natural Products [facility]) CAPS Take 2 capsules by mouth daily. For prostate health    Historical Provider, MD  oxyCODONE-acetaminophen (ROXICET) 5-325 MG per tablet Take 1-2 tablets by mouth every 4 (four) hours as needed for severe pain. 04/20/14   Augustin Schooling, MD  sildenafil (VIAGRA) 50 MG tablet Take 25 mg by mouth as needed for erectile dysfunction.  08/25/13   Historical Provider, MD  warfarin (COUMADIN) 5 MG tablet Take 1 tablet (5 mg total) by mouth daily. 04/20/14   Augustin Schooling, MD  warfarin (COUMADIN) 5 MG tablet Take 1 tablet (5 mg total) by mouth daily. Take Coumadin for three  weeks and then discontinue.  The dose may need to be adjusted based upon the INR.  Please follow the INR and titrate Coumadin dose for a therapeutic range between 2.0 and 3.0 INR.  After completing the three weeks of Coumadin, the patient may stop the Coumadin and resume their 81 mg Aspirin daily. 04/22/14   Arlee Muslim, PA-C   Triage Vitals: BP 152/79 mmHg  Pulse 99  Temp(Src) 99.5 F (37.5 C) (Oral)  Resp 22  Ht 5\' 8"  (1.727 m)  Wt 158 lb (71.668 kg)  BMI 24.03 kg/m2  SpO2 100% Physical Exam  Constitutional: He is oriented to person, place, and time. He appears well-developed and well-nourished. No distress.  HENT:  Head: Normocephalic and atraumatic.  Eyes: EOM are normal. Pupils are equal, round, and reactive to light.  Neck: Normal range of motion. Neck supple. No JVD present.  Cardiovascular: Normal rate, regular rhythm and normal heart sounds.   No murmur heard. Pulmonary/Chest: Effort normal and breath sounds normal. He has no wheezes. He has no rales. He exhibits no tenderness.  Abdominal: Soft. Bowel sounds are normal. He exhibits no distension and no mass. There is no tenderness.  Genitourinary:  Catheter in place streaming clear urine.   Musculoskeletal: Normal range of motion. He exhibits no edema.  Left knee: Moderate effusion. Dressing present over incision, not removed.    Lymphadenopathy:    He has no cervical adenopathy.  Neurological: He is alert and oriented to person, place, and time. No cranial nerve deficit. Coordination normal.  Skin: Skin is warm and dry. No rash noted.  Psychiatric: He has a normal mood and affect. His behavior is normal. Judgment and thought content normal.  Nursing note and vitals reviewed.   ED Course  Procedures (including critical care time)  COORDINATION OF CARE: 12:10 AM- Plans for "warfarin test" to evaluate platelet count. Discussed treatment plan with patient at bedside and patient agreed to plan.   Labs Review Results for  orders placed or performed during the hospital encounter of 04/25/14  Protime-INR  Result Value Ref Range   Prothrombin Time 59.6 (H) 11.6 - 15.2 seconds   INR 6.82 (HH) 0.00 - 1.49   MDM   Final diagnoses:  Warfarin-induced coagulopathy    Over anticoagulation with warfarin. Old records are reviewed confirming recent hospitalization and INR in the high therapeutic range after only 2 days of warfarin therapy. INR is repeated here and is elevated at 6.82. However, he does not have any active bleeding. He is given a small dose of vitamin K and advised to not take his warfarin today or tomorrow and he is to follow-up with his PCP  to restart warfarin at an appropriate time and had an appropriate dose. Alternate consideration would be to treat him with rivaroxaban.   I personally performed the services described in this documentation, which was scribed in my presence. The recorded information has been reviewed and is accurate.    Delora Fuel, MD 06/77/03 4035

## 2014-04-26 LAB — PROTIME-INR
INR: 6.82 (ref 0.00–1.49)
Prothrombin Time: 59.6 s — ABNORMAL HIGH (ref 11.6–15.2)

## 2014-04-26 MED ORDER — PHYTONADIONE 5 MG PO TABS
2.5000 mg | ORAL_TABLET | Freq: Once | ORAL | Status: AC
  Administered 2014-04-26: 2.5 mg via ORAL
  Filled 2014-04-26: qty 1

## 2014-04-26 NOTE — Discharge Instructions (Signed)
He will need to coordinate your warfarin therapy with the doctor who has been monitoring your warfarin. Do not take any warfarin today or tomorrow. He will need to have INR checked in the next 1-2 days and will get instructions on how to adjust her dose based on those results.

## 2014-04-26 NOTE — ED Notes (Signed)
Reported PT/INR to MD Roxanne Mins

## 2014-04-26 NOTE — ED Notes (Signed)
Pt. Left with all belongings 

## 2014-05-01 NOTE — Discharge Summary (Signed)
Physician Discharge Summary   Patient ID: Jeffrey Marks MRN: [id number] DOB/AGE: 05/12/1940 74 y.o.  Admit date: 04/20/2014 Discharge date: 04/22/2014  Primary Diagnosis:  Left knee end-stage osteoarthritis. Difficult urethral catheterization  Admission Diagnoses:  Past Medical History  Diagnosis Date  . Anxiety   . Arthritis   . Cancer   . Cataract     s/p  . Thyroid disease   . Basal cell carcinoma     rt. forearm and nose  . Hypertension   . Heart murmur   . Hypothyroidism    Discharge Diagnoses:   Active Problems:   S/P knee replacement  Estimated body mass index is 24.03 kg/(m^2) as calculated from the following:   Height as of 04/10/14: _0  (1.727 m).   Weight as of this encounter: 71.668 kg (158 lb).  Procedure:  Procedure(s) (LRB): LEFT TOTAL KNEE ARTHROPLASTY (Left) CYSTOSCOPY   Consults: urology  HPI: The patient is a 74 year old male who has had progressive left knee pain following a knee arthroscopy. The patient has had worsening knee arthritis in the medial and patellofemoral compartments. Patient now has inability to walk more than a block due to pain. He ambulates with the use of assistive device and has significant pain both at rest and at night. Discussed options of management, the patient elected to proceed with total knee arthroplasty to restore function and to eliminate pain to his knee. Informed consent obtained. Laboratory Data: Admission on 04/20/2014, Discharged on 04/22/2014  Component Date Value Ref Range Status  . Prothrombin Time 04/21/2014 15.6* 11.6 - 15.2 seconds Final  . INR 04/21/2014 1.22  0.00 - 1.49 Final  . WBC 04/21/2014 13.6* 4.0 - 10.5 K/uL Final  . RBC 04/21/2014 3.80* 4.22 - 5.81 MIL/uL Final  . Hemoglobin 04/21/2014 11.8* 13.0 - 17.0 g/dL Final  . HCT 04/21/2014 34.1* 39.0 - 52.0 % Final  . MCV 04/21/2014 89.7  78.0 - 100.0 fL Final  . MCH 04/21/2014 31.1  26.0 - 34.0 pg Final  . MCHC 04/21/2014 34.6  30.0 -  36.0 g/dL Final  . RDW 04/21/2014 12.5  11.5 - 15.5 % Final  . Platelets 04/21/2014 211  150 - 400 K/uL Final  . Sodium 04/21/2014 138  135 - 145 mmol/L Final  . Potassium 04/21/2014 3.8  3.5 - 5.1 mmol/L Final  . Chloride 04/21/2014 107  96 - 112 mmol/L Final  . CO2 04/21/2014 19  19 - 32 mmol/L Final  . Glucose, Bld 04/21/2014 163* 70 - 99 mg/dL Final  . BUN 04/21/2014 15  6 - 23 mg/dL Final  . Creatinine, Ser 04/21/2014 1.19  0.50 - 1.35 mg/dL Final  . Calcium 04/21/2014 8.0* 8.4 - 10.5 mg/dL Final  . GFR calc non Af Amer 04/21/2014 58* >90 mL/min Final  . GFR calc Af Amer 04/21/2014 67* >90 mL/min Final   Comment: (NOTE) The eGFR has been calculated using the CKD EPI equation. This calculation has not been validated in all clinical situations. eGFR's persistently <90 mL/min signify possible Chronic Kidney Disease.   . Anion gap 04/21/2014 12  5 - 15 Final  . Prothrombin Time 04/22/2014 29.8* 11.6 - 15.2 seconds Final  . INR 04/22/2014 2.81* 0.00 - 1.49 Final  . WBC 04/22/2014 12.0* 4.0 - 10.5 K/uL Final  . RBC 04/22/2014 3.12* 4.22 - 5.81 MIL/uL Final  . Hemoglobin 04/22/2014 10.0* 13.0 - 17.0 g/dL Final  . HCT 04/22/2014 28.4* 39.0 - 52.0 % Final  . MCV 04/22/2014 91.0  78.0 - 100.0 fL Final  . MCH 04/22/2014 32.1  26.0 - 34.0 pg Final  . MCHC 04/22/2014 35.2  30.0 - 36.0 g/dL Final  . RDW 04/22/2014 12.8  11.5 - 15.5 % Final  . Platelets 04/22/2014 159  150 - 400 K/uL Final   REPEATED TO [facility] Outpatient Visit on 04/10/2014  Component Date Value Ref Range Status  . MRSA, PCR 04/10/2014 NEGATIVE  NEGATIVE Final  . Staphylococcus aureus 04/10/2014 NEGATIVE  NEGATIVE Final   Comment:        The Xpert SA Assay (FDA approved for NASAL specimens in patients over 9 years of age), is one component of a comprehensive surveillance program.  Test performance has been validated by [employer] for patients greater than or equal to 41 year old. It is not intended to  diagnose infection nor to guide or monitor treatment.   . Prothrombin Time 04/10/2014 13.1  11.6 - 15.2 seconds Final  . INR 04/10/2014 0.98  0.00 - 1.49 Final  . aPTT 04/10/2014 32  24 - 37 seconds Final  . ABO/RH(D) 04/10/2014 B POS   Final  . Antibody Screen 04/10/2014 NEG   Final  . Sample Expiration 04/10/2014 04/24/2014   Final  . Sodium 04/10/2014 139  135 - 145 mmol/L Final   Please note change in reference range.  . Potassium 04/10/2014 4.0  3.5 - 5.1 mmol/L Final   Please note change in reference range.  . Chloride 04/10/2014 108  96 - 112 mEq/L Final  . CO2 04/10/2014 25  19 - 32 mmol/L Final  . Glucose, Bld 04/10/2014 96  70 - 99 mg/dL Final  . BUN 04/10/2014 19  6 - 23 mg/dL Final  . Creatinine, Ser 04/10/2014 1.34  0.50 - 1.35 mg/dL Final  . Calcium 04/10/2014 8.7  8.4 - 10.5 mg/dL Final  . GFR calc non Af Amer 04/10/2014 50* >90 mL/min Final  . GFR calc Af Amer 04/10/2014 58* >90 mL/min Final   Comment: (NOTE) The eGFR has been calculated using the CKD EPI equation. This calculation has not been validated in all clinical situations. eGFR's persistently <90 mL/min signify possible Chronic Kidney Disease.   . Anion gap 04/10/2014 6  5 - 15 Final  . WBC 04/10/2014 8.0  4.0 - 10.5 K/uL Final  . RBC 04/10/2014 4.85  4.22 - 5.81 MIL/uL Final  . Hemoglobin 04/10/2014 15.2  13.0 - 17.0 g/dL Final  . HCT 04/10/2014 43.9  39.0 - 52.0 % Final  . MCV 04/10/2014 90.5  78.0 - 100.0 fL Final  . MCH 04/10/2014 31.3  26.0 - 34.0 pg Final  . MCHC 04/10/2014 34.6  30.0 - 36.0 g/dL Final  . RDW 04/10/2014 12.6  11.5 - 15.5 % Final  . Platelets 04/10/2014 229  150 - 400 K/uL Final  . ABO/RH(D) 04/10/2014 B POS   Final     X-Rays:Dg Knee Left Port  04/20/2014   CLINICAL DATA:  Knee replacement.  EXAM: PORTABLE LEFT KNEE - 1-2 VIEW  COMPARISON:  None.  FINDINGS: Patient status post total left knee replacement. Good anatomic alignment. Hardware intact.  IMPRESSION: Total left knee  replacement with good anatomic alignment.   Electronically Signed   By: Marcello Moores  Register   On: 04/20/2014 16:08    EKG: Orders placed or performed during the hospital encounter of 04/20/14  . EKG 12-Lead  . EKG 12-Lead     Hospital Course: QUENTEZ LOBER is a 74 y.o. who  was admitted to Hospital. They were brought to the operating room on 04/20/2014 and underwent Procedure(s): LEFT TOTAL KNEE ARTHROPLASTY and CYSTOSCOPY.  Patient tolerated the procedure well and was later transferred to the recovery room and then to the orthopaedic floor for postoperative care.  They were given PO and IV analgesics for pain control following their surgery.  They were given 24 hours of postoperative antibiotics of  Anti-infectives    Start     Dose/Rate Route Frequency Ordered Stop   04/20/14 1730  ceFAZolin (ANCEF) IVPB 2 g/50 mL premix     2 g100 mL/hr over 30 Minutes Intravenous Every 6 hours 04/20/14 1719 04/21/14 0127   04/20/14 0600  ceFAZolin (ANCEF) IVPB 2 g/50 mL premix     2 g100 mL/hr over 30 Minutes Intravenous On call to O.R. 04/19/14 1319 04/20/14 1155     and started on DVT prophylaxis in the form of Coumadin.   PT and OT were ordered for total joint protocol.  Discharge planning consulted to help with postop disposition and equipment needs.  Patient had a decent night on the evening of surgery.  They started to get up OOB with therapy on day one. Hemovac drain was pulled without difficulty.  Continued to work with therapy into day two.  Dressing was changed on day two and the incision was healing well.  Patient was seen in rounds on day two and was ready to go home. Foley catheter left in place. Will leave in at discharge and follow up in one week with Dr. Alinda Money. He wants to follow up with Dr. Alinda Money. Recommend in one week for voiding trial.  Discharge home with home health  DVT Prophylaxis - Coumadin Weight-Bearing as tolerated to left leg Patient wants to go home today. DC home F/U in  two weeks Disposition - home COD - Improved      Discharge Instructions    Call MD / Call 911    Complete by:  As directed   If you experience chest pain or shortness of breath, CALL 911 and be transported to the hospital emergency room.  If you develope a fever above 101 F, pus (white drainage) or increased drainage or redness at the wound, or calf pain, call your surgeon's office.     Change dressing    Complete by:  As directed   Change dressing daily with sterile 4 x 4 inch gauze dressing and apply TED hose. Do not submerge the incision under water.     Constipation Prevention    Complete by:  As directed   Drink plenty of fluids.  Prune juice may be helpful.  You may use a stool softener, such as Colace (over the counter) 100 mg twice a day.  Use MiraLax (over the counter) for constipation as needed.     Diet - low sodium heart healthy    Complete by:  As directed      Discharge instructions    Complete by:  As directed   Pick up stool softner and laxative for home use following surgery while on pain medications. Do not submerge incision under water. Please use good hand washing techniques while changing dressing each day. May shower starting three days after surgery. Continue to use ice for pain and swelling after surgery. Do not use any lotions or creams on the incision until instructed by your surgeon.  Postoperative Constipation Protocol  Constipation - defined medically as fewer than three stools per week and severe constipation as  less than one stool per week.  One of the most common issues patients have following surgery is constipation.  Even if you have a regular bowel pattern at home, your normal regimen is likely to be disrupted due to multiple reasons following surgery.  Combination of anesthesia, postoperative narcotics, change in appetite and fluid intake all can affect your bowels.  In order to avoid complications following surgery, here are some recommendations in  order to help you during your recovery period.  Colace (docusate) - Pick up an over-the-counter form of Colace or another stool softener and take twice a day as long as you are requiring postoperative pain medications.  Take with a full glass of water daily.  If you experience loose stools or diarrhea, hold the colace until you stool forms back up.  If your symptoms do not get better within 1 week or if they get worse, check with your doctor.  Dulcolax (bisacodyl) - Pick up over-the-counter and take as directed by the product packaging as needed to assist with the movement of your bowels.  Take with a full glass of water.  Use this product as needed if not relieved by Colace only.   MiraLax (polyethylene glycol) - Pick up over-the-counter to have on hand.  MiraLax is a solution that will increase the amount of water in your bowels to assist with bowel movements.  Take as directed and can mix with a glass of water, juice, soda, coffee, or tea.  Take if you go more than two days without a movement. Do not use MiraLax more than once per day. Call your doctor if you are still constipated or irregular after using this medication for 7 days in a row.  If you continue to have problems with postoperative constipation, please contact the office for further assistance and recommendations.  If you experience "the worst abdominal pain ever" or develop nausea or vomiting, please contact the office immediatly for further recommendations for treatment.   Take Coumadin for three weeks and then discontinue.  The dose may need to be adjusted based upon the INR.  Please follow the INR and titrate Coumadin dose for a therapeutic range between 2.0 and 3.0 INR.  After completing the three weeks of Coumadin, the patient may stop the Coumadin and resume their 81 mg Aspirin daily.  Home Health to draw blood levels and regulate the Coumadin dosing.     Do not put a pillow under the knee. Place it under the heel.    Complete  by:  As directed      Do not sit on low chairs, stoools or toilet seats, as it may be difficult to get up from low surfaces    Complete by:  As directed      Driving restrictions    Complete by:  As directed   No driving until released by the physician.     Follow the hip precautions as taught in Physical Therapy    Complete by:  As directed      Increase activity slowly as tolerated    Complete by:  As directed      Lifting restrictions    Complete by:  As directed   No lifting until released by the physician.     Patient may shower    Complete by:  As directed   You may shower without a dressing once there is no drainage.  Do not wash over the wound.  If drainage remains, do not shower until  drainage stops.     TED hose    Complete by:  As directed   Use stockings (TED hose) for 3 weeks on both leg(s).  You may remove them at night for sleeping.     Weight bearing as tolerated    Complete by:  As directed             Medication List    STOP taking these medications        ibuprofen 200 MG tablet  Commonly known as:  ADVIL,MOTRIN      TAKE these medications        ALPRAZolam 0.5 MG tablet  Commonly known as:  XANAX  Take 0.5 mg by mouth 2 (two) times daily.     levothyroxine 112 MCG tablet  Commonly known as:  SYNTHROID, LEVOTHROID  Take 112 mcg by mouth daily before breakfast.     lisinopril 20 MG tablet  Commonly known as:  PRINIVIL,ZESTRIL  Take 20 mg by mouth daily.     methocarbamol 500 MG tablet  Commonly known as:  ROBAXIN  Take 1 tablet (500 mg total) by mouth 3 (three) times daily as needed.     oxyCODONE-acetaminophen 5-325 MG per tablet  Commonly known as:  ROXICET  Take 1-2 tablets by mouth every 4 (four) hours as needed for severe pain.     sildenafil 50 MG tablet  Commonly known as:  VIAGRA  Take 25 mg by mouth as needed for erectile dysfunction.     URINOZINC Caps  Take 2 capsules by mouth daily. For prostate health     warfarin 5 MG  tablet  Commonly known as:  COUMADIN  Take 1 tablet (5 mg total) by mouth daily.     warfarin 5 MG tablet  Commonly known as:  COUMADIN  Take 1 tablet (5 mg total) by mouth daily. Take Coumadin for three weeks and then discontinue.  The dose may need to be adjusted based upon the INR.  Please follow the INR and titrate Coumadin dose for a therapeutic range between 2.0 and 3.0 INR.  After completing the three weeks of Coumadin, the patient may stop the Coumadin and resume their 81 mg Aspirin daily.       Follow-up Information    Follow up with NORRIS,STEVEN R, MD. Call in 2 weeks.   Specialty:  Orthopedic Surgery   Why:  [phone]   Contact information:   [address] [place] [address] [place] [address] [phone]       Follow up with Dutch Gray, MD.   Specialty:  Urology   Why:  Will call to arrange   Contact information:   [place] [address] [phone]       Follow up with Augustin Schooling, MD. Schedule an appointment as soon as possible for a visit in 2 weeks.   Specialty:  Orthopedic Surgery   Why:  Call office at [phone] to set up appointment with Dr. Almon Hercules information:   [address] [place] [address] [place] [address] [phone]       Signed: Arlee Muslim, PA-C Orthopaedic Surgery 05/01/2014, 10:36 AM

## 2014-06-25 ENCOUNTER — Encounter: Payer: Self-pay | Admitting: Gastroenterology

## 2015-05-29 IMAGING — CR DG KNEE 1-2V PORT*L*
2 series · 2 of 2 positions shown · non-contrast
Comparison: None.

CLINICAL DATA: Knee replacement.

EXAM:
PORTABLE LEFT KNEE - 1-2 VIEW

[AP]
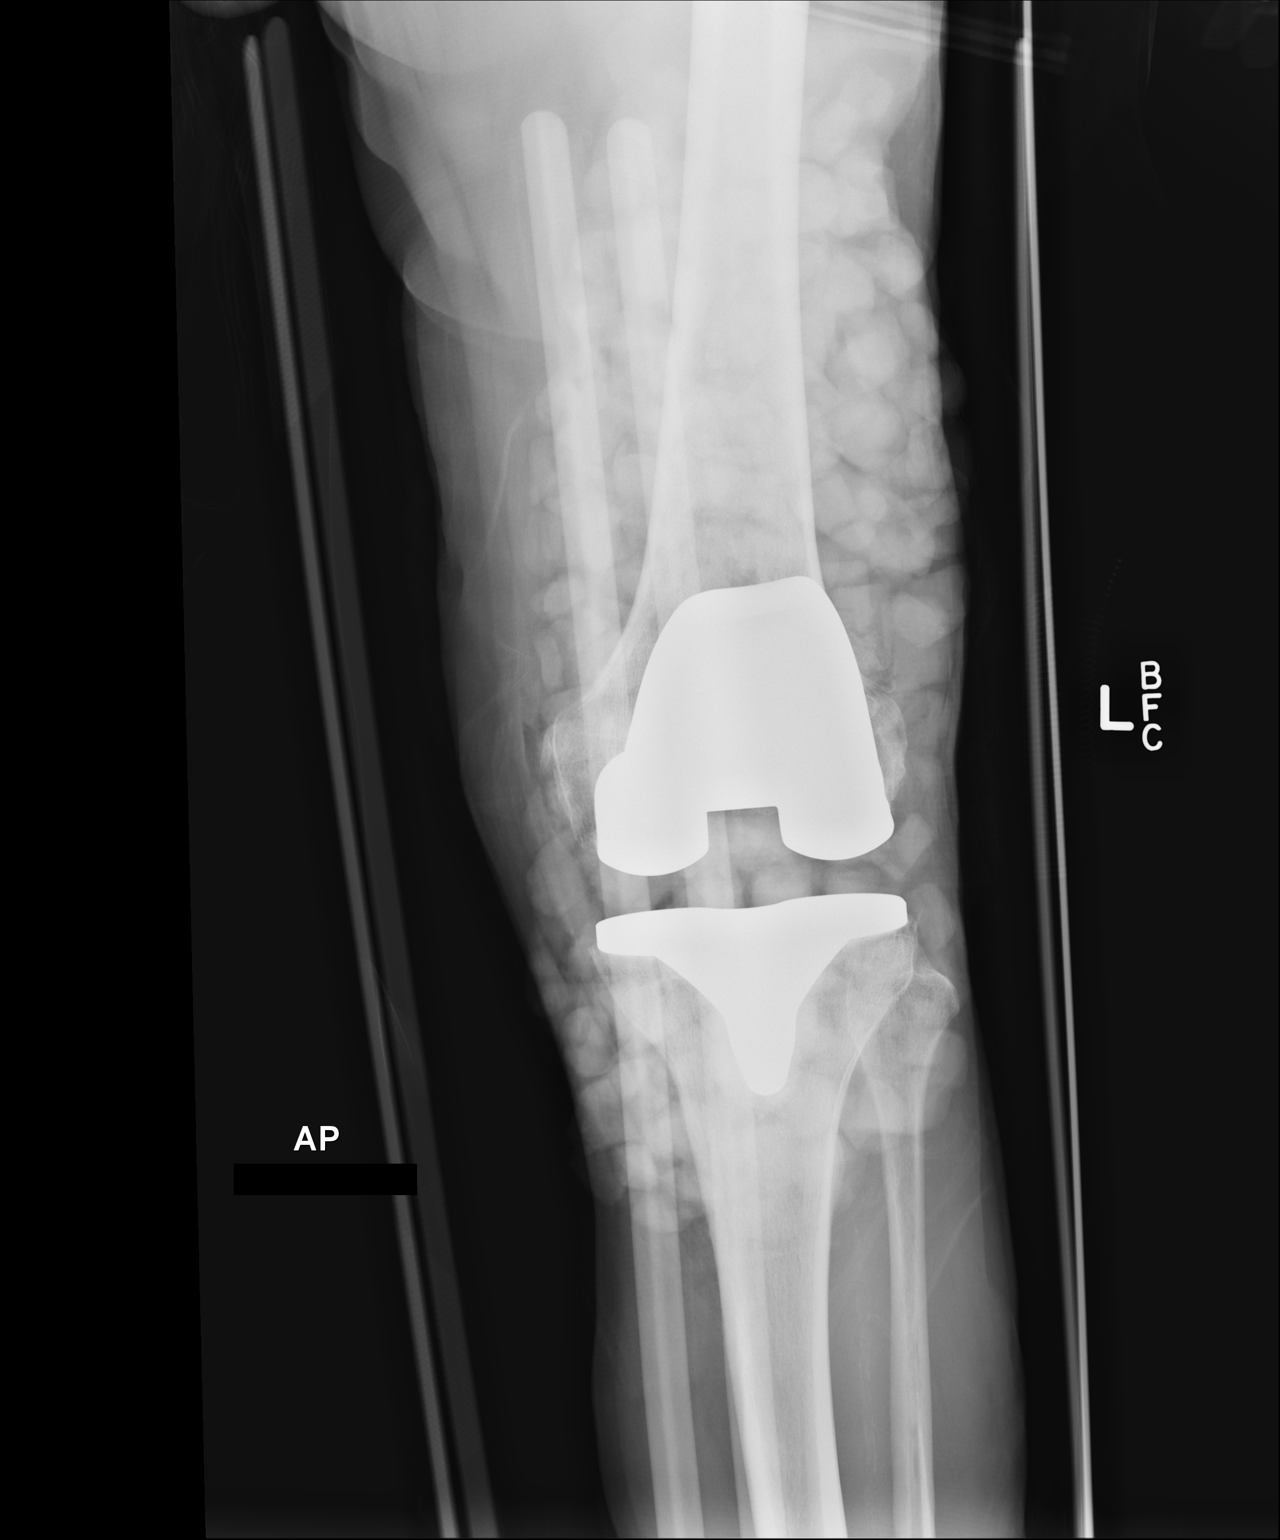

[xtable lateral]
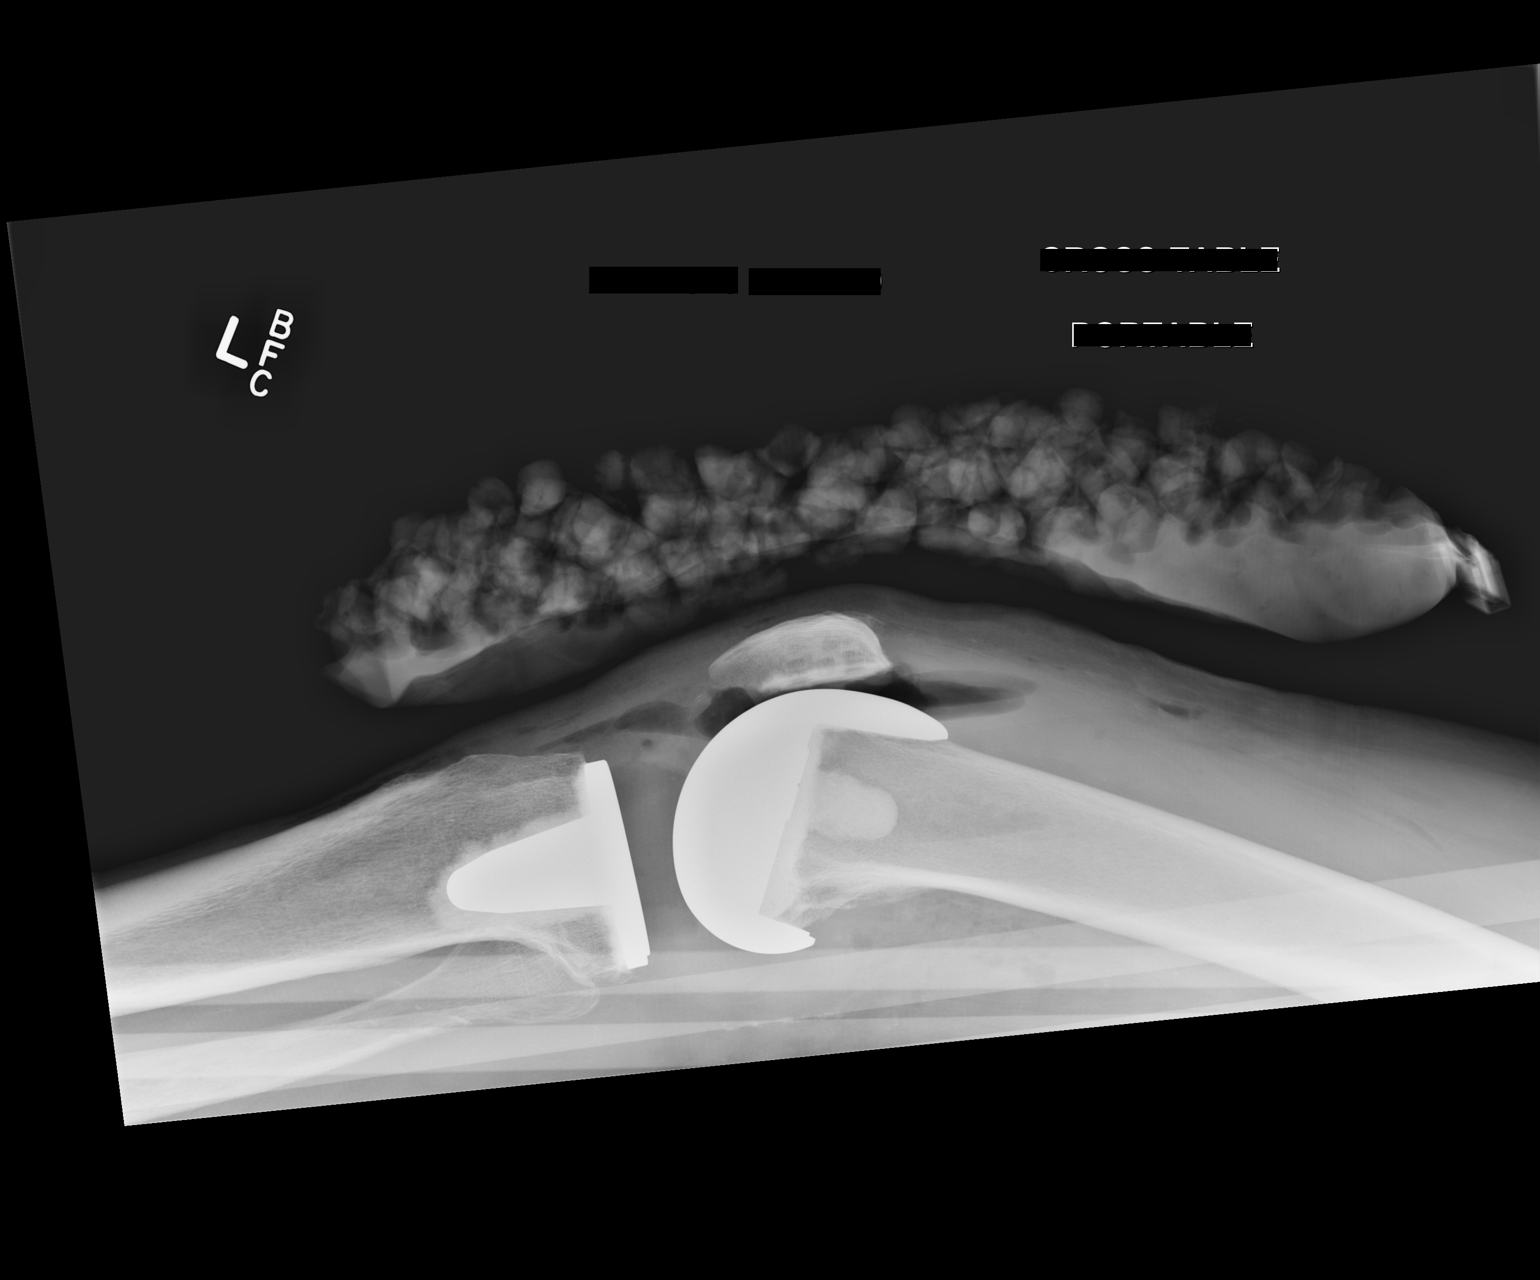

[2 of 2 positions shown; findings below may reference images not displayed]

FINDINGS: Patient status post total left knee replacement. Good anatomic
alignment. Hardware intact.
IMPRESSION: Total left knee replacement with good anatomic alignment.

## 2016-04-17 DIAGNOSIS — H9193 Unspecified hearing loss, bilateral: Secondary | ICD-10-CM | POA: Diagnosis not present

## 2016-07-27 DIAGNOSIS — I10 Essential (primary) hypertension: Secondary | ICD-10-CM | POA: Diagnosis not present

## 2016-07-27 DIAGNOSIS — Z0000 Encounter for general adult medical examination without abnormal findings: Secondary | ICD-10-CM | POA: Diagnosis not present

## 2016-07-27 DIAGNOSIS — E039 Hypothyroidism, unspecified: Secondary | ICD-10-CM | POA: Diagnosis not present

## 2016-07-27 DIAGNOSIS — Z1211 Encounter for screening for malignant neoplasm of colon: Secondary | ICD-10-CM | POA: Diagnosis not present

## 2016-07-27 DIAGNOSIS — F411 Generalized anxiety disorder: Secondary | ICD-10-CM | POA: Diagnosis not present

## 2016-11-10 DIAGNOSIS — R300 Dysuria: Secondary | ICD-10-CM | POA: Diagnosis not present

## 2016-11-10 DIAGNOSIS — N401 Enlarged prostate with lower urinary tract symptoms: Secondary | ICD-10-CM | POA: Diagnosis not present

## 2016-11-10 DIAGNOSIS — R351 Nocturia: Secondary | ICD-10-CM | POA: Diagnosis not present

## 2016-12-08 DIAGNOSIS — R351 Nocturia: Secondary | ICD-10-CM | POA: Diagnosis not present

## 2016-12-08 DIAGNOSIS — N401 Enlarged prostate with lower urinary tract symptoms: Secondary | ICD-10-CM | POA: Diagnosis not present

## 2016-12-15 DIAGNOSIS — M6281 Muscle weakness (generalized): Secondary | ICD-10-CM | POA: Diagnosis not present

## 2016-12-15 DIAGNOSIS — R350 Frequency of micturition: Secondary | ICD-10-CM | POA: Diagnosis not present

## 2016-12-15 DIAGNOSIS — M62838 Other muscle spasm: Secondary | ICD-10-CM | POA: Diagnosis not present

## 2016-12-15 DIAGNOSIS — R278 Other lack of coordination: Secondary | ICD-10-CM | POA: Diagnosis not present

## 2016-12-15 DIAGNOSIS — R3915 Urgency of urination: Secondary | ICD-10-CM | POA: Diagnosis not present

## 2016-12-28 DIAGNOSIS — E039 Hypothyroidism, unspecified: Secondary | ICD-10-CM | POA: Diagnosis not present

## 2016-12-28 DIAGNOSIS — I10 Essential (primary) hypertension: Secondary | ICD-10-CM | POA: Diagnosis not present

## 2016-12-28 DIAGNOSIS — F411 Generalized anxiety disorder: Secondary | ICD-10-CM | POA: Diagnosis not present

## 2016-12-28 DIAGNOSIS — Z23 Encounter for immunization: Secondary | ICD-10-CM | POA: Diagnosis not present

## 2017-02-01 DIAGNOSIS — L111 Transient acantholytic dermatosis [Grover]: Secondary | ICD-10-CM | POA: Diagnosis not present

## 2017-05-21 DIAGNOSIS — N3281 Overactive bladder: Secondary | ICD-10-CM | POA: Diagnosis not present

## 2017-06-28 DIAGNOSIS — F411 Generalized anxiety disorder: Secondary | ICD-10-CM | POA: Diagnosis not present

## 2017-06-28 DIAGNOSIS — E039 Hypothyroidism, unspecified: Secondary | ICD-10-CM | POA: Diagnosis not present

## 2017-06-28 DIAGNOSIS — I10 Essential (primary) hypertension: Secondary | ICD-10-CM | POA: Diagnosis not present

## 2017-06-28 DIAGNOSIS — E7800 Pure hypercholesterolemia, unspecified: Secondary | ICD-10-CM | POA: Diagnosis not present

## 2017-06-28 DIAGNOSIS — N3281 Overactive bladder: Secondary | ICD-10-CM | POA: Diagnosis not present

## 2017-06-28 DIAGNOSIS — R748 Abnormal levels of other serum enzymes: Secondary | ICD-10-CM | POA: Diagnosis not present

## 2017-08-30 DIAGNOSIS — R748 Abnormal levels of other serum enzymes: Secondary | ICD-10-CM | POA: Diagnosis not present

## 2017-10-13 DIAGNOSIS — Z23 Encounter for immunization: Secondary | ICD-10-CM | POA: Diagnosis not present

## 2018-01-13 DIAGNOSIS — I10 Essential (primary) hypertension: Secondary | ICD-10-CM | POA: Diagnosis not present

## 2018-01-13 DIAGNOSIS — E039 Hypothyroidism, unspecified: Secondary | ICD-10-CM | POA: Diagnosis not present

## 2018-01-13 DIAGNOSIS — H9190 Unspecified hearing loss, unspecified ear: Secondary | ICD-10-CM | POA: Diagnosis not present

## 2018-01-13 DIAGNOSIS — F411 Generalized anxiety disorder: Secondary | ICD-10-CM | POA: Diagnosis not present

## 2018-01-13 DIAGNOSIS — Z23 Encounter for immunization: Secondary | ICD-10-CM | POA: Diagnosis not present

## 2018-01-13 DIAGNOSIS — L989 Disorder of the skin and subcutaneous tissue, unspecified: Secondary | ICD-10-CM | POA: Diagnosis not present

## 2018-01-13 DIAGNOSIS — Z0000 Encounter for general adult medical examination without abnormal findings: Secondary | ICD-10-CM | POA: Diagnosis not present

## 2018-01-26 DIAGNOSIS — L820 Inflamed seborrheic keratosis: Secondary | ICD-10-CM | POA: Diagnosis not present

## 2018-01-26 DIAGNOSIS — C44319 Basal cell carcinoma of skin of other parts of face: Secondary | ICD-10-CM | POA: Diagnosis not present

## 2018-04-06 DIAGNOSIS — Z08 Encounter for follow-up examination after completed treatment for malignant neoplasm: Secondary | ICD-10-CM | POA: Diagnosis not present

## 2018-04-06 DIAGNOSIS — Z85828 Personal history of other malignant neoplasm of skin: Secondary | ICD-10-CM | POA: Diagnosis not present

## 2018-04-06 DIAGNOSIS — L111 Transient acantholytic dermatosis [Grover]: Secondary | ICD-10-CM | POA: Diagnosis not present

## 2018-08-04 DIAGNOSIS — N401 Enlarged prostate with lower urinary tract symptoms: Secondary | ICD-10-CM | POA: Diagnosis not present

## 2018-08-04 DIAGNOSIS — R350 Frequency of micturition: Secondary | ICD-10-CM | POA: Diagnosis not present

## 2018-08-04 DIAGNOSIS — I10 Essential (primary) hypertension: Secondary | ICD-10-CM | POA: Diagnosis not present

## 2018-08-04 DIAGNOSIS — E039 Hypothyroidism, unspecified: Secondary | ICD-10-CM | POA: Diagnosis not present

## 2018-08-04 DIAGNOSIS — Z96652 Presence of left artificial knee joint: Secondary | ICD-10-CM | POA: Diagnosis not present

## 2018-08-04 DIAGNOSIS — M1711 Unilateral primary osteoarthritis, right knee: Secondary | ICD-10-CM | POA: Diagnosis not present

## 2018-08-04 DIAGNOSIS — F411 Generalized anxiety disorder: Secondary | ICD-10-CM | POA: Diagnosis not present

## 2018-08-04 DIAGNOSIS — H9192 Unspecified hearing loss, left ear: Secondary | ICD-10-CM | POA: Diagnosis not present

## 2018-10-20 DIAGNOSIS — M25561 Pain in right knee: Secondary | ICD-10-CM | POA: Diagnosis not present

## 2018-10-20 DIAGNOSIS — M1711 Unilateral primary osteoarthritis, right knee: Secondary | ICD-10-CM | POA: Diagnosis not present

## 2018-12-08 DIAGNOSIS — Z13220 Encounter for screening for lipoid disorders: Secondary | ICD-10-CM | POA: Diagnosis not present

## 2018-12-08 DIAGNOSIS — E039 Hypothyroidism, unspecified: Secondary | ICD-10-CM | POA: Diagnosis not present

## 2018-12-08 DIAGNOSIS — N401 Enlarged prostate with lower urinary tract symptoms: Secondary | ICD-10-CM | POA: Diagnosis not present

## 2018-12-08 DIAGNOSIS — L989 Disorder of the skin and subcutaneous tissue, unspecified: Secondary | ICD-10-CM | POA: Diagnosis not present

## 2018-12-08 DIAGNOSIS — I10 Essential (primary) hypertension: Secondary | ICD-10-CM | POA: Diagnosis not present

## 2018-12-08 DIAGNOSIS — R350 Frequency of micturition: Secondary | ICD-10-CM | POA: Diagnosis not present

## 2018-12-08 DIAGNOSIS — R238 Other skin changes: Secondary | ICD-10-CM | POA: Diagnosis not present

## 2018-12-13 DIAGNOSIS — D485 Neoplasm of uncertain behavior of skin: Secondary | ICD-10-CM | POA: Diagnosis not present

## 2018-12-13 DIAGNOSIS — L723 Sebaceous cyst: Secondary | ICD-10-CM | POA: Diagnosis not present

## 2018-12-13 DIAGNOSIS — L821 Other seborrheic keratosis: Secondary | ICD-10-CM | POA: Diagnosis not present

## 2018-12-22 DIAGNOSIS — L570 Actinic keratosis: Secondary | ICD-10-CM | POA: Diagnosis not present

## 2018-12-22 DIAGNOSIS — X32XXXD Exposure to sunlight, subsequent encounter: Secondary | ICD-10-CM | POA: Diagnosis not present

## 2018-12-22 DIAGNOSIS — Z08 Encounter for follow-up examination after completed treatment for malignant neoplasm: Secondary | ICD-10-CM | POA: Diagnosis not present

## 2018-12-22 DIAGNOSIS — Z85828 Personal history of other malignant neoplasm of skin: Secondary | ICD-10-CM | POA: Diagnosis not present

## 2018-12-30 DIAGNOSIS — R05 Cough: Secondary | ICD-10-CM | POA: Diagnosis not present

## 2018-12-30 DIAGNOSIS — R509 Fever, unspecified: Secondary | ICD-10-CM | POA: Diagnosis not present

## 2019-01-02 DIAGNOSIS — R509 Fever, unspecified: Secondary | ICD-10-CM | POA: Diagnosis not present

## 2019-01-02 DIAGNOSIS — R05 Cough: Secondary | ICD-10-CM | POA: Diagnosis not present

## 2019-02-07 DIAGNOSIS — R5383 Other fatigue: Secondary | ICD-10-CM | POA: Diagnosis not present

## 2019-02-07 DIAGNOSIS — E039 Hypothyroidism, unspecified: Secondary | ICD-10-CM | POA: Diagnosis not present

## 2019-02-07 DIAGNOSIS — I10 Essential (primary) hypertension: Secondary | ICD-10-CM | POA: Diagnosis not present

## 2019-02-07 DIAGNOSIS — Z8619 Personal history of other infectious and parasitic diseases: Secondary | ICD-10-CM | POA: Diagnosis not present

## 2019-03-09 DIAGNOSIS — M1711 Unilateral primary osteoarthritis, right knee: Secondary | ICD-10-CM | POA: Diagnosis not present

## 2019-04-27 DIAGNOSIS — R350 Frequency of micturition: Secondary | ICD-10-CM | POA: Diagnosis not present

## 2019-04-27 DIAGNOSIS — E039 Hypothyroidism, unspecified: Secondary | ICD-10-CM | POA: Diagnosis not present

## 2019-04-27 DIAGNOSIS — I10 Essential (primary) hypertension: Secondary | ICD-10-CM | POA: Diagnosis not present

## 2019-05-21 ENCOUNTER — Ambulatory Visit: Payer: Medicare Other | Attending: Internal Medicine

## 2019-05-21 DIAGNOSIS — Z23 Encounter for immunization: Secondary | ICD-10-CM

## 2019-05-21 NOTE — Progress Notes (Signed)
   Covid-19 Vaccination Clinic  Name:  Jeffrey Marks    MRN: [id number] DOB: September 03, 1940  05/21/2019  Jeffrey Marks was observed post Covid-19 immunization for 15 minutes without incidence. He was provided with Vaccine Information Sheet and instruction to access the V-Safe system.   Jeffrey Marks was instructed to call 911 with any severe reactions post vaccine: Marland Kitchen Difficulty breathing  . Swelling of your face and throat  . A fast heartbeat  . A bad rash all over your body  . Dizziness and weakness    Immunizations Administered    Name Date Dose VIS Date Route   Pfizer COVID-19 Vaccine 05/21/2019 11:02 AM 0.3 mL 03/10/2019 Intramuscular   Manufacturer: [place]   Lot: [id number]   [place]: [id number]

## 2019-06-14 ENCOUNTER — Ambulatory Visit: Payer: PPO | Attending: Internal Medicine

## 2019-06-14 DIAGNOSIS — Z23 Encounter for immunization: Secondary | ICD-10-CM

## 2019-06-14 NOTE — Progress Notes (Signed)
   Covid-19 Vaccination Clinic  Name:  SWEN KERNS    MRN: [id number] DOB: [birth year]  06/14/2019  Mr. Calderas was observed post Covid-19 immunization for 15 minutes without incident. He was provided with Vaccine Information Sheet and instruction to access the V-Safe system.   Mr. Lillquist was instructed to call 911 with any severe reactions post vaccine: Marland Kitchen Difficulty breathing  . Swelling of face and throat  . A fast heartbeat  . A bad rash all over body  . Dizziness and weakness   Immunizations Administered    Name Date Dose VIS Date Route   Pfizer COVID-19 Vaccine 06/14/2019  9:10 AM 0.3 mL 03/10/2019 Intramuscular   Manufacturer: [place]   Lot: [id number]   [place]: [id number]

## 2019-06-20 DIAGNOSIS — R350 Frequency of micturition: Secondary | ICD-10-CM | POA: Diagnosis not present

## 2019-06-20 DIAGNOSIS — N401 Enlarged prostate with lower urinary tract symptoms: Secondary | ICD-10-CM | POA: Diagnosis not present

## 2019-06-20 DIAGNOSIS — R300 Dysuria: Secondary | ICD-10-CM | POA: Diagnosis not present

## 2019-07-05 DIAGNOSIS — E039 Hypothyroidism, unspecified: Secondary | ICD-10-CM | POA: Diagnosis not present

## 2019-07-07 DIAGNOSIS — N401 Enlarged prostate with lower urinary tract symptoms: Secondary | ICD-10-CM | POA: Diagnosis not present

## 2019-07-07 DIAGNOSIS — I10 Essential (primary) hypertension: Secondary | ICD-10-CM | POA: Diagnosis not present

## 2019-07-07 DIAGNOSIS — R350 Frequency of micturition: Secondary | ICD-10-CM | POA: Diagnosis not present

## 2019-07-07 DIAGNOSIS — R300 Dysuria: Secondary | ICD-10-CM | POA: Diagnosis not present

## 2019-07-07 DIAGNOSIS — E039 Hypothyroidism, unspecified: Secondary | ICD-10-CM | POA: Diagnosis not present

## 2019-07-14 DIAGNOSIS — R3121 Asymptomatic microscopic hematuria: Secondary | ICD-10-CM | POA: Diagnosis not present

## 2019-07-14 DIAGNOSIS — Q541 Hypospadias, penile: Secondary | ICD-10-CM | POA: Diagnosis not present

## 2019-07-14 DIAGNOSIS — N320 Bladder-neck obstruction: Secondary | ICD-10-CM | POA: Diagnosis not present

## 2019-07-14 DIAGNOSIS — R350 Frequency of micturition: Secondary | ICD-10-CM | POA: Diagnosis not present

## 2019-07-14 DIAGNOSIS — R102 Pelvic and perineal pain: Secondary | ICD-10-CM | POA: Diagnosis not present

## 2019-07-14 DIAGNOSIS — N401 Enlarged prostate with lower urinary tract symptoms: Secondary | ICD-10-CM | POA: Diagnosis not present

## 2019-07-20 DIAGNOSIS — R3121 Asymptomatic microscopic hematuria: Secondary | ICD-10-CM | POA: Diagnosis not present

## 2019-07-20 DIAGNOSIS — N281 Cyst of kidney, acquired: Secondary | ICD-10-CM | POA: Diagnosis not present

## 2019-07-20 DIAGNOSIS — R3129 Other microscopic hematuria: Secondary | ICD-10-CM | POA: Diagnosis not present

## 2019-07-27 ENCOUNTER — Other Ambulatory Visit: Payer: Self-pay | Admitting: Urology

## 2019-08-25 NOTE — Patient Instructions (Addendum)
DUE TO COVID-19 ONLY ONE VISITOR IS ALLOWED TO COME WITH YOU AND STAY IN THE WAITING ROOM ONLY DURING PRE OP AND PROCEDURE DAY OF SURGERY. THE 1 VISITOR MAY VISIT WITH YOU AFTER SURGERY IN YOUR PRIVATE ROOM DURING VISITING HOURS ONLY!  YOU NEED TO HAVE A COVID 19 TEST ON: 09/04/19 @       , THIS TEST MUST BE DONE BEFORE SURGERY, COME  [place], [place]  , [id number].  ([place]) ONCE YOUR COVID TEST IS COMPLETED, PLEASE BEGIN THE QUARANTINE INSTRUCTIONS AS OUTLINED IN YOUR HANDOUT.                Jeffrey Marks    Your procedure is scheduled on: 09/07/19   Report to [facility]  Entrance   Report to admitting at: 1:30 PM     Call this number if you have problems the morning of surgery [phone]    Remember: Do not eat solid food :After Midnight. Clear liquid diet from midnight until 12:30 pm.   CLEAR LIQUID DIET   Foods Allowed                                                                     Foods Excluded  Coffee and tea, regular and decaf                             liquids that you cannot  Plain Jell-O any favor except red or purple                                           see through such as: Fruit ices (not with fruit pulp)                                     milk, soups, orange juice  Iced Popsicles                                    All solid food Carbonated beverages, regular and diet                                    Cranberry, grape and apple juices Sports drinks like Gatorade Lightly seasoned clear broth or consume(fat free) Sugar, honey syrup  Sample Menu Breakfast                                Lunch                                     Supper Cranberry juice                    Beef broth  Chicken broth Jell-O                                     Grape juice                           Apple juice Coffee or tea                        Jell-O                                      Popsicle                                                 Coffee or tea                        Coffee or tea  _____________________________________________________________________  BRUSH YOUR TEETH MORNING OF SURGERY AND RINSE YOUR MOUTH OUT, NO CHEWING GUM CANDY OR MINTS.     Take these medicines the morning of surgery with A SIP OF WATER: levothyroxine.Xanax as needed.                                 You may not have any metal on your body including hair pins and              piercings  Do not wear jewelry, lotions, powders or perfumes, deodorant             Men may shave face and neck.   Do not bring valuables to the hospital. [employer].  Contacts, dentures or bridgework may not be worn into surgery.  Leave suitcase in the car. After surgery it may be brought to your room.     Patients discharged the day of surgery will not be allowed to drive home. IF YOU ARE HAVING SURGERY AND GOING HOME THE SAME DAY, YOU MUST HAVE AN ADULT TO DRIVE YOU HOME AND BE WITH YOU FOR 24 HOURS. YOU MAY GO HOME BY TAXI OR UBER OR ORTHERWISE, BUT AN ADULT MUST ACCOMPANY YOU HOME AND STAY WITH YOU FOR 24 HOURS.  Name and phone number of your driver:  Special Instructions: N/A              Please read over the following fact sheets you were given: _____________________________________________________________________  [facility] - Preparing for Surgery Before surgery, you can play an important role.  Because skin is not sterile, your skin needs to be as free of germs as possible.  You can reduce the number of germs on your skin by washing with CHG (chlorahexidine gluconate) soap before surgery.  CHG is an antiseptic cleaner which kills germs and bonds with the skin to continue killing germs even after washing. Please DO NOT use if you have an allergy to CHG or antibacterial soaps.  If your skin becomes reddened/irritated stop using the CHG and inform your nurse when you  arrive at  Short Stay. Do not shave (including legs and underarms) for at least 48 hours prior to the first CHG shower.  You may shave your face/neck. Please follow these instructions carefully:  1.  Shower with CHG Soap the night before surgery and the  morning of Surgery.  2.  If you choose to wash your hair, wash your hair first as usual with your  normal  shampoo.  3.  After you shampoo, rinse your hair and body thoroughly to remove the  shampoo.                           4.  Use CHG as you would any other liquid soap.  You can apply chg directly  to the skin and wash                       Gently with a scrungie or clean washcloth.  5.  Apply the CHG Soap to your body ONLY FROM THE NECK DOWN.   Do not use on face/ open                           Wound or open sores. Avoid contact with eyes, ears mouth and genitals (private parts).                       Wash face,  Genitals (private parts) with your normal soap.             6.  Wash thoroughly, paying special attention to the area where your surgery  will be performed.  7.  Thoroughly rinse your body with warm water from the neck down.  8.  DO NOT shower/wash with your normal soap after using and rinsing off  the CHG Soap.                9.  Pat yourself dry with a clean towel.            10.  Wear clean pajamas.            11.  Place clean sheets on your bed the night of your first shower and do not  sleep with pets. Day of Surgery : Do not apply any lotions/deodorants the morning of surgery.  Please wear clean clothes to the hospital/surgery center.  FAILURE TO FOLLOW THESE INSTRUCTIONS MAY RESULT IN THE CANCELLATION OF YOUR SURGERY PATIENT SIGNATURE_________________________________  NURSE SIGNATURE__________________________________  ________________________________________________________________________

## 2019-08-29 ENCOUNTER — Encounter (HOSPITAL_COMMUNITY)
Admission: RE | Admit: 2019-08-29 | Discharge: 2019-08-29 | Disposition: A | Discharge status: Home / Self Care | Payer: PPO | Source: Ambulatory Visit | Attending: Urology | Admitting: Urology

## 2019-08-29 ENCOUNTER — Encounter (HOSPITAL_COMMUNITY): Admission: RE | Admit: 2019-08-29 | Payer: PPO | Source: Ambulatory Visit

## 2019-08-29 NOTE — Progress Notes (Signed)
RN called pt. For PST telephone interview,as per pt. He is planning to postpone the surgery,he did not mention any specific reason,he only said that he will take it easy.When RN asked pt. If he notified the surgeon about his decision,he said: No. You are the first person I told.RN call Dr's Alinda Money scheduler and left a message on the answer machine.

## 2019-09-02 ENCOUNTER — Other Ambulatory Visit (HOSPITAL_COMMUNITY): Payer: PPO

## 2019-09-07 ENCOUNTER — Ambulatory Visit: Admit: 2019-09-07 | Payer: PPO | Admitting: Urology

## 2019-09-07 SURGERY — CYSTOSCOPY, WITH URETHRAL DILATION
Anesthesia: General

## 2019-12-19 DIAGNOSIS — F411 Generalized anxiety disorder: Secondary | ICD-10-CM | POA: Diagnosis not present

## 2019-12-19 DIAGNOSIS — R350 Frequency of micturition: Secondary | ICD-10-CM | POA: Diagnosis not present

## 2019-12-19 DIAGNOSIS — E785 Hyperlipidemia, unspecified: Secondary | ICD-10-CM | POA: Diagnosis not present

## 2019-12-19 DIAGNOSIS — Z0000 Encounter for general adult medical examination without abnormal findings: Secondary | ICD-10-CM | POA: Diagnosis not present

## 2019-12-19 DIAGNOSIS — I10 Essential (primary) hypertension: Secondary | ICD-10-CM | POA: Diagnosis not present

## 2019-12-19 DIAGNOSIS — E039 Hypothyroidism, unspecified: Secondary | ICD-10-CM | POA: Diagnosis not present

## 2019-12-19 DIAGNOSIS — M1711 Unilateral primary osteoarthritis, right knee: Secondary | ICD-10-CM | POA: Diagnosis not present

## 2019-12-19 DIAGNOSIS — N401 Enlarged prostate with lower urinary tract symptoms: Secondary | ICD-10-CM | POA: Diagnosis not present

## 2019-12-19 DIAGNOSIS — R238 Other skin changes: Secondary | ICD-10-CM | POA: Diagnosis not present

## 2020-01-04 DIAGNOSIS — Z23 Encounter for immunization: Secondary | ICD-10-CM | POA: Diagnosis not present
